# Patient Record
Sex: Male | Born: 1970
Health system: Southern US, Community
[De-identification: ages and names within clinical notes are randomized; demographics above are authoritative.]

## PROBLEM LIST (undated history)

## (undated) DIAGNOSIS — Z8619 Personal history of other infectious and parasitic diseases: Secondary | ICD-10-CM

## (undated) DIAGNOSIS — G43909 Migraine, unspecified, not intractable, without status migrainosus: Secondary | ICD-10-CM

## (undated) DIAGNOSIS — R519 Headache, unspecified: Secondary | ICD-10-CM

## (undated) DIAGNOSIS — G629 Polyneuropathy, unspecified: Secondary | ICD-10-CM

## (undated) DIAGNOSIS — R51 Headache: Secondary | ICD-10-CM

## (undated) HISTORY — PX: OTHER SURGICAL HISTORY: SHX169

## (undated) HISTORY — DX: Headache, unspecified: R51.9

## (undated) HISTORY — DX: Headache: R51

## (undated) HISTORY — PX: WISDOM TOOTH EXTRACTION: SHX21

## (undated) HISTORY — DX: Migraine, unspecified, not intractable, without status migrainosus: G43.909

## (undated) HISTORY — DX: Polyneuropathy, unspecified: G62.9

## (undated) HISTORY — DX: Personal history of other infectious and parasitic diseases: Z86.19

---

## 2015-05-31 ENCOUNTER — Telehealth: Payer: Self-pay | Admitting: Behavioral Health

## 2015-05-31 NOTE — Telephone Encounter (Signed)
Unable to reach patient at time of Pre-Visit Call. Per recording the number has been disconnected or no longer in service.

## 2015-06-01 ENCOUNTER — Ambulatory Visit (INDEPENDENT_AMBULATORY_CARE_PROVIDER_SITE_OTHER): Payer: Federal, State, Local not specified - PPO | Admitting: Physician Assistant

## 2015-06-01 ENCOUNTER — Encounter: Payer: Self-pay | Admitting: Physician Assistant

## 2015-06-01 VITALS — BP 108/76 | HR 81 | Temp 98.4°F | Resp 16 | Ht 70.5 in | Wt 213.1 lb

## 2015-06-01 DIAGNOSIS — G44209 Tension-type headache, unspecified, not intractable: Secondary | ICD-10-CM | POA: Diagnosis not present

## 2015-06-01 DIAGNOSIS — R195 Other fecal abnormalities: Secondary | ICD-10-CM

## 2015-06-01 MED ORDER — BACLOFEN 10 MG PO TABS: 10.0000 mg | ORAL_TABLET | Freq: Three times a day (TID) | ORAL | Status: DC

## 2015-06-01 MED ORDER — BUTALBITAL-ASPIRIN-CAFFEINE 50-325-40 MG PO CAPS: 1.0000 | ORAL_CAPSULE | Freq: Four times a day (QID) | ORAL | Status: DC | PRN

## 2015-06-01 NOTE — Patient Instructions (Signed)
Please go to the lab to pick up stool kit.  Please stay well hydrated. Take the Baclofen as directed when needed for tension headaches. Also take the Fiorinal as directed for tension headaches. Alternate ice/heat to neck when these headaches occur. Follow-up if these measures are not helping.

## 2015-06-01 NOTE — Progress Notes (Signed)
Patient presents to clinic today to establish care.  Patient c/o of intermittent headaches last for a couple of days described as a grip around his head associated with muscle tension and neck pain. Denies injury or trauma. Denies nausea, photophobia or phonophobia with headaches. Has taken OTC pain relievers with little relief of symptoms.  Patient endorses multi-year history of having multiple loose stools throughout the day. Denies abdominal pain, tenesmus, melena or hematochezia. Denies fever, chills or foreign travel. Is taking Immodium daily to help slow stools. Has never had assessment for this issue.  Past Medical History  Diagnosis Date  . Migraine   . Frequent headaches   . History of chicken pox   . Neuropathy     Right foot    Past Surgical History  Procedure Laterality Date  . Pinched nerve foot      Left  . Wisdom tooth extraction      No current outpatient prescriptions on file prior to visit.   No current facility-administered medications on file prior to visit.    Allergies  Allergen Reactions  . Hydrocodone Nausea And Vomiting  . Oxycodone Nausea And Vomiting    Family History  Problem Relation Age of Onset  . Migraines Mother     Living  . Healthy Father     Living  . Heart failure Maternal Grandmother   . Healthy Daughter     x1  . Allergies Son     x1    Social History   Social History  . Marital Status: Married    Spouse Name: N/A  . Number of Children: N/A  . Years of Education: N/A   Occupational History  . Not on file.   Social History Main Topics  . Smoking status: Former Smoker    Types: Cigarettes  . Smokeless tobacco: Never Used  . Alcohol Use: Not on file  . Drug Use: Not on file  . Sexual Activity: Not on file   Other Topics Concern  . Not on file   Social History Narrative   Review of Systems  Eyes: Negative for blurred vision, double vision and photophobia.  Gastrointestinal: Positive for diarrhea. Negative  for heartburn, nausea, vomiting, abdominal pain, constipation, blood in stool and melena.  Neurological: Positive for headaches. Negative for dizziness and loss of consciousness.  Psychiatric/Behavioral: The patient is not nervous/anxious.     BP 108/76 mmHg  Pulse 81  Temp(Src) 98.4 F (36.9 C) (Oral)  Resp 16  Ht 5' 10.5" (1.791 m)  Wt 213 lb 2 oz (96.673 kg)  BMI 30.14 kg/m2  SpO2 98%  Physical Exam  Constitutional: He is oriented to person, place, and time and well-developed, well-nourished, and in no distress.  HENT:  Head: Normocephalic and atraumatic.  Eyes: Conjunctivae are normal.  Neck: Muscular tenderness present.  Cardiovascular: Normal rate, regular rhythm, normal heart sounds and intact distal pulses.   Pulmonary/Chest: Effort normal and breath sounds normal. No respiratory distress. He has no wheezes. He has no rales. He exhibits no tenderness.  Abdominal: Normal appearance and bowel sounds are normal. There is no hepatosplenomegaly. There is no tenderness. There is no CVA tenderness. No hernia.  Musculoskeletal:       Cervical back: He exhibits tenderness, pain and spasm. He exhibits normal range of motion and no bony tenderness.  Neurological: He is alert and oriented to person, place, and time.  Skin: Skin is warm and dry. No rash noted.  Vitals reviewed.   No  results found for this or any previous visit (from the past 2160 hour(s)).  Assessment/Plan: Tension headache Rx Baclofen for muscle relaxation. Rx Fiorinal for tension headache. Alternate ice/heat. Limit heavy lifting. Follow-up if not improving.  Abnormal stools Chronic. Will obtain stool studies. Patient to start probiotic daily along with fiber supplement. Follow-up based on stool results.

## 2015-06-01 NOTE — Progress Notes (Signed)
Pre visit review using our clinic review tool, if applicable. No additional management support is needed unless otherwise documented below in the visit note/SLS  

## 2015-06-02 DIAGNOSIS — G44209 Tension-type headache, unspecified, not intractable: Secondary | ICD-10-CM | POA: Insufficient documentation

## 2015-06-02 DIAGNOSIS — R195 Other fecal abnormalities: Secondary | ICD-10-CM | POA: Insufficient documentation

## 2015-06-02 NOTE — Assessment & Plan Note (Signed)
Rx Baclofen for muscle relaxation. Rx Fiorinal for tension headache. Alternate ice/heat. Limit heavy lifting. Follow-up if not improving.

## 2015-06-02 NOTE — Assessment & Plan Note (Signed)
Chronic. Will obtain stool studies. Patient to start probiotic daily along with fiber supplement. Follow-up based on stool results.

## 2015-06-03 LAB — FECAL LACTOFERRIN, QUANT: LACTOFERRIN: NEGATIVE

## 2015-06-03 LAB — OVA AND PARASITE EXAMINATION: OP: NONE SEEN

## 2015-06-06 LAB — STOOL CULTURE

## 2015-06-07 ENCOUNTER — Other Ambulatory Visit: Payer: Self-pay | Admitting: Physician Assistant

## 2015-06-07 ENCOUNTER — Encounter: Payer: Self-pay | Admitting: Physician Assistant

## 2015-06-07 DIAGNOSIS — K529 Noninfective gastroenteritis and colitis, unspecified: Secondary | ICD-10-CM

## 2015-06-08 ENCOUNTER — Telehealth: Payer: Self-pay | Admitting: Physician Assistant

## 2015-06-08 DIAGNOSIS — G44229 Chronic tension-type headache, not intractable: Secondary | ICD-10-CM

## 2015-06-08 MED ORDER — TIZANIDINE HCL 2 MG PO CAPS: 2.0000 mg | ORAL_CAPSULE | Freq: Three times a day (TID) | ORAL | Status: DC

## 2015-06-08 NOTE — Telephone Encounter (Signed)
Please assess for any alarm symptoms. Stop the Baclofen We will send in Rx Zanaflex  to take as directed. Continue other medication as directed. Will also place referral to Neurology.

## 2015-06-08 NOTE — Telephone Encounter (Signed)
Patient informed, understood & agreed; no current emergent symptoms, discussed need for ED symptoms/SLS

## 2015-06-08 NOTE — Telephone Encounter (Signed)
Pt wife Lanora Manis, called stating pt is taking meds as directed. He is not feeling any better,maybe worse. She said that the headaches went away very briefly but came right back. Please call back at [phone].

## 2015-06-09 NOTE — Telephone Encounter (Signed)
If symptoms are severe he needs to go to the ER.

## 2015-06-09 NOTE — Telephone Encounter (Signed)
Neurology gave pt appt for 07/16/15. Pt took the Zanaflex and it didn't work either. He has taken 3 times. They want to know what to do.

## 2015-06-09 NOTE — Addendum Note (Signed)
Addended by: Marcelline Mates on: 06/09/2015 09:16 AM   Modules accepted: Orders

## 2015-06-09 NOTE — Telephone Encounter (Signed)
Spoke with patient, he states he is taking the Zanaflex and Fiorinal.  He got the Zanaflex yesterday and has taken around 4 times.  He states his headache goes away and comes back- he does not want to go to ED because it is mild-moderate.  He will give the Zanaflex more time.

## 2015-06-09 NOTE — Telephone Encounter (Signed)
Referral placed.

## 2015-06-09 NOTE — Telephone Encounter (Signed)
See below. Recommend giving the medication more time and make sure he is taking the other medications given. If patient still having severe headache, he needs ER assessment for prolonged headache.

## 2015-06-10 ENCOUNTER — Other Ambulatory Visit: Payer: Self-pay | Admitting: Physician Assistant

## 2015-06-14 NOTE — Telephone Encounter (Signed)
Rx faxed to pharmacy/SLS 

## 2015-06-29 ENCOUNTER — Encounter: Payer: Self-pay | Admitting: Physician Assistant

## 2015-06-29 ENCOUNTER — Other Ambulatory Visit (INDEPENDENT_AMBULATORY_CARE_PROVIDER_SITE_OTHER): Payer: Federal, State, Local not specified - PPO

## 2015-06-29 ENCOUNTER — Ambulatory Visit (INDEPENDENT_AMBULATORY_CARE_PROVIDER_SITE_OTHER): Payer: Federal, State, Local not specified - PPO | Admitting: Physician Assistant

## 2015-06-29 VITALS — BP 108/78 | HR 72 | Ht 70.0 in | Wt 211.2 lb

## 2015-06-29 DIAGNOSIS — R197 Diarrhea, unspecified: Secondary | ICD-10-CM | POA: Diagnosis not present

## 2015-06-29 LAB — COMPREHENSIVE METABOLIC PANEL
ALT: 29 U/L (ref 0–53)
AST: 22 U/L (ref 0–37)
Albumin: 4.5 g/dL (ref 3.5–5.2)
Alkaline Phosphatase: 73 U/L (ref 39–117)
BUN: 30 mg/dL — ABNORMAL HIGH (ref 6–23)
CALCIUM: 9.7 mg/dL (ref 8.4–10.5)
CHLORIDE: 105 meq/L (ref 96–112)
CO2: 27 meq/L (ref 19–32)
Creatinine, Ser: 1.09 mg/dL (ref 0.40–1.50)
GFR: 78.92 mL/min (ref >60.00)
GLUCOSE: 107 mg/dL — AB (ref 70–99)
Potassium: 4 mEq/L (ref 3.5–5.1)
Sodium: 138 mEq/L (ref 135–145)
Total Bilirubin: 0.3 mg/dL (ref 0.2–1.2)
Total Protein: 7.6 g/dL (ref 6.0–8.3)

## 2015-06-29 LAB — CBC WITH DIFFERENTIAL/PLATELET
BASOS ABS: 0 10*3/uL (ref 0.0–0.1)
BASOS PCT: 0.5 % (ref 0.0–3.0)
EOS ABS: 0.2 10*3/uL (ref 0.0–0.7)
Eosinophils Relative: 2 % (ref 0.0–5.0)
HEMATOCRIT: 44.1 % (ref 39.0–52.0)
Hemoglobin: 15 g/dL (ref 13.0–17.0)
LYMPHS ABS: 1.4 10*3/uL (ref 0.7–4.0)
LYMPHS PCT: 17 % (ref 12.0–46.0)
MCHC: 34.1 g/dL (ref 30.0–36.0)
MCV: 87.2 fl (ref 78.0–100.0)
Monocytes Absolute: 0.7 10*3/uL (ref 0.1–1.0)
Monocytes Relative: 7.9 % (ref 3.0–12.0)
NEUTROS ABS: 6 10*3/uL (ref 1.4–7.7)
NEUTROS PCT: 72.6 % (ref 43.0–77.0)
PLATELETS: 238 10*3/uL (ref 150.0–400.0)
RBC: 5.05 Mil/uL (ref 4.22–5.81)
RDW: 13 % (ref 11.5–15.5)
WBC: 8.2 10*3/uL (ref 4.0–10.5)

## 2015-06-29 LAB — IGA: IGA: 189 mg/dL (ref 68–378)

## 2015-06-29 LAB — TSH: TSH: 0.57 u[IU]/mL (ref 0.35–4.50)

## 2015-06-29 LAB — HIGH SENSITIVITY CRP: CRP HIGH SENSITIVITY: 1.98 mg/L (ref 0.000–5.000)

## 2015-06-29 MED ORDER — NA SULFATE-K SULFATE-MG SULF 17.5-3.13-1.6 GM/177ML PO SOLN: 1.0000 | ORAL | Status: DC

## 2015-06-29 NOTE — Progress Notes (Signed)
Patient ID: Scott Cummings, male   DOB: Jul 17, 1971, 44 y.o.   MRN: [id number]    HPI:  Scott Cummings is a 44 y.o.   male  referred by Waldon Merl, PA-C for evaluation of diarrhea. Scott Cummings states that up until 4 years ago, he would have a normal formed bowel movement on a daily basis. For the past 3-1/2-4 years, he has been having 5-6 mushy bowel movements daily. He has no nocturnal stooling. He has started using Imodium on a daily basis. With Imodium, he has 2-3 mushy bowel movements. He has no urgency and denies excessive gas and bloating. He is unable to identify any specific foods that exacerbate his symptoms. He has no associated abdominal pain, and has not had fever, chills, or night sweats. His appetite as been good and his weight has been stable. He has no complaints of epigastric pain, nausea, vomiting, or early satiety. He denies bright red blood per rectum or melena. He does state that his stools are often very oily and foul-smelling. Prior to his change in bowel habits, he had not traveled outside of the country, acquired any new pets, or had any antibiotics. He has city water and does not fish or swim in lakes or streams. No one else in the house has diarrhea. He is unaware of a family history of colon cancer, colon polyps, inflammatory bowel disease, or celiac disease. He was recently seen by Malva Cogan in at [facility] in [facility] and had stool for ova and parasites and stool culture that were negative. He has not had any associated joint pain, eye pain, oral ulcers, or skin rashes.    Past Medical History  Diagnosis Date  . Migraine   . Frequent headaches   . History of chicken pox   . Neuropathy (HCC)     Right foot    Past Surgical History  Procedure Laterality Date  . Pinched nerve foot      Left  . Wisdom tooth extraction     Family History  Problem Relation Age of Onset  . Migraines Mother     Living  . Healthy Father     Living  . Heart failure Maternal  Grandmother   . Healthy Daughter     x1  . Allergies Son     x1  . Stomach cancer      Great uncle   Social History  Substance Use Topics  . Smoking status: Former Smoker    Types: Cigarettes  . Smokeless tobacco: Never Used  . Alcohol Use: 0.0 oz/week    0 Standard drinks or equivalent per week     Comment: Very rare   Current Outpatient Prescriptions  Medication Sig Dispense Refill  . baclofen (LIORESAL) 10 MG tablet Take 1 tablet (10 mg total) by mouth 3 (three) times daily. 30 each 0  . butalbital-acetaminophen-caffeine (FIORICET, ESGIC) 50-325-40 MG tablet TAKE 1 TABLET BY MOUTH EVERY 6 HOURS AS NEEDED FOR HEADACHE 20 tablet 0  . butalbital-aspirin-caffeine (FIORINAL) 50-325-40 MG per capsule Take 1 capsule by mouth every 6 (six) hours as needed for headache. 20 capsule 0  . diazepam (VALIUM) 10 MG tablet Take 5-10 mg by mouth at bedtime as needed for sleep.    Marland Kitchen glucosamine-chondroitin 500-400 MG tablet Take 1 tablet by mouth daily.    Marland Kitchen ibuprofen (ADVIL,MOTRIN) 200 MG tablet Take 200 mg by mouth every 6 (six) hours as needed.    . loperamide (IMODIUM) 2 MG capsule Take  by mouth as needed for diarrhea or loose stools.    . Misc Natural Products (CURCUMAX PRO) TABS Take by mouth.    . Misc Natural Products (TURMERIC CURCUMIN) CAPS Take by mouth daily.    . Probiotic Product (PROBIOTIC & ACIDOPHILUS EX ST PO) Take by mouth daily.    . tizanidine (ZANAFLEX) 2 MG capsule Take 1 capsule (2 mg total) by mouth 3 (three) times daily. 90 capsule 1   No current facility-administered medications for this visit.   Allergies  Allergen Reactions  . Hydrocodone Nausea And Vomiting  . Oxycodone Nausea And Vomiting     Review of Systems: Gen: Denies any fever, chills, sweats, anorexia, fatigue, weakness, malaise, weight loss, and sleep disorder CV: Denies chest pain, angina, palpitations, syncope, orthopnea, PND, peripheral edema, and claudication. Resp: Denies dyspnea at rest,  dyspnea with exercise, cough, sputum, wheezing, coughing up blood, and pleurisy. GI: Denies vomiting blood, jaundice, and fecal incontinence.   Denies dysphagia or odynophagia. GU : Denies urinary burning, blood in urine, urinary frequency, urinary hesitancy, nocturnal urination, and urinary incontinence. MS: Denies joint pain, limitation of movement, and swelling, stiffness, low back pain, extremity pain. Denies muscle weakness, cramps, atrophy.  Derm: Denies rash, itching, dry skin, hives, moles, warts, or unhealing ulcers.  Psych: Denies depression, anxiety, memory loss, suicidal ideation, hallucinations, paranoia, and confusion. Heme: Denies bruising, bleeding, and enlarged lymph nodes. Neuro:  Denies any headaches, dizziness, paresthesias. Endo:  Denies any problems with DM, thyroid, adrenal function  LAB RESULTS: Stool culture 06/02/2015 was negative for salmonella, shigella, Campylobacter, Yersinia, or Escherichia coli. Stool for ova and parasites on 06/02/2015 had no ova or parasites seen.     Physical Exam: BP 108/78 mmHg  Pulse 72  Ht 5\' 10"  (1.778 m)  Wt 211 lb 4 oz (95.822 kg)  BMI 30.31 kg/m2 Constitutional: Pleasant,well-developed,male in no acute distress. HEENT: Normocephalic and atraumatic. Conjunctivae are normal. No scleral icterus. Neck supple.  No JVD. No thyromegaly Cardiovascular: Normal rate, regular rhythm.  Pulmonary/chest: Effort normal and breath sounds normal. No wheezing, rales or rhonchi. Abdominal: Soft, nondistended, nontender. Bowel sounds active throughout. There are no masses palpable. No hepatomegaly. Extremities: no edema Lymphadenopathy: No cervical adenopathy noted. Neurological: Alert and oriented to person place and time. Skin: Skin is warm and dry. No rashes noted. Psychiatric: Normal mood and affect. Behavior is normal.  ASSESSMENT AND PLAN:  44 year old male with a 4 year history of diarrhea referred for evaluation. Stool culture and  recent stool for O&P have been nonrevealing. A CBC, comprehensive metabolic panel, TSH, CRP, IgA, and TTG will be obtained along with a stool for C. difficile and a pancreatic fecal elastase. He will be scheduled for a colonoscopy to evaluate for polyps, neoplasia, IBD, or microscopic colitis.The risks, benefits, and alternatives to colonoscopy with possible biopsy and possible polypectomy were discussed with the patient and they consent to proceed. The procedure will be scheduled with Dr. Christella HartiganJacobs. Further recommendations will be made pending the findings of the above.    Scott Cummings, Tollie PizzaLori P PA-C 06/29/2015, 9:27 AM  CC: Waldon MerlMartin, William C, PA-C

## 2015-06-29 NOTE — Patient Instructions (Signed)
You have been scheduled for a colonoscopy. Please follow written instructions given to you at your visit today.  Please pick up your prep supplies at the pharmacy within the next 1-3 days. If you use inhalers (even only as needed), please bring them with you on the day of your procedure. Your physician has requested that you go to www.startemmi.com and enter the access code given to you at your visit today. This web site gives a general overview about your procedure. However, you should still follow specific instructions given to you by our office regarding your preparation for the procedure.  Your physician has requested that you go to the basement for lab work before leaving today.  

## 2015-06-30 ENCOUNTER — Other Ambulatory Visit: Payer: Federal, State, Local not specified - PPO

## 2015-06-30 ENCOUNTER — Telehealth: Payer: Self-pay | Admitting: Physician Assistant

## 2015-06-30 DIAGNOSIS — R197 Diarrhea, unspecified: Secondary | ICD-10-CM

## 2015-06-30 LAB — TISSUE TRANSGLUTAMINASE, IGA: Tissue Transglutaminase Ab, IgA: 1 U/mL (ref <4)

## 2015-06-30 NOTE — Progress Notes (Signed)
i agree with the above note, plan 

## 2015-06-30 NOTE — Telephone Encounter (Signed)
Spoke to patient. He will come by the office tomorrow and pick up a sample of Suprep.

## 2015-07-01 LAB — CLOSTRIDIUM DIFFICILE BY PCR: Toxigenic C. Difficile by PCR: NOT DETECTED

## 2015-07-08 ENCOUNTER — Telehealth: Payer: Self-pay | Admitting: Gastroenterology

## 2015-07-08 NOTE — Telephone Encounter (Signed)
Instructed wife that he is fine to have his colon as scheduled tomorrow.  Told her there may be small pieces in the colon, have him drink extra this afternoon and do prep as instructed. Wife verbalized understanding of these instructions.  Hilda LiasMarie PV

## 2015-07-09 ENCOUNTER — Ambulatory Visit (AMBULATORY_SURGERY_CENTER): Payer: Federal, State, Local not specified - PPO | Admitting: Gastroenterology

## 2015-07-09 ENCOUNTER — Encounter: Payer: Self-pay | Admitting: Gastroenterology

## 2015-07-09 VITALS — BP 105/72 | HR 65 | Temp 98.6°F | Resp 11 | Ht 70.0 in | Wt 211.0 lb

## 2015-07-09 DIAGNOSIS — R197 Diarrhea, unspecified: Secondary | ICD-10-CM

## 2015-07-09 LAB — PANCREATIC ELASTASE, FECAL: Pancreatic Elastase-1, Stool: 348 mcg/g

## 2015-07-09 MED ORDER — SODIUM CHLORIDE 0.9 % IV SOLN: 500.0000 mL | INTRAVENOUS | Status: DC

## 2015-07-09 NOTE — Progress Notes (Signed)
Report to PACU, RN, vss, BBS= Clear.  

## 2015-07-09 NOTE — Progress Notes (Signed)
Called to room to assist during endoscopic procedure.  Patient ID and intended procedure confirmed with present staff. Received instructions for my participation in the procedure from the performing physician.  

## 2015-07-09 NOTE — Patient Instructions (Signed)
Discharge instructions given. Normal exam. Resume previous medications. YOU HAD AN ENDOSCOPIC PROCEDURE TODAY AT THE [facility]:   Refer to the procedure report that was given to you for any specific questions about what was found during the examination.  If the procedure report does not answer your questions, please call your gastroenterologist to clarify.  If you requested that your care partner not be given the details of your procedure findings, then the procedure report has been included in a sealed envelope for you to review at your convenience later.  YOU SHOULD EXPECT: Some feelings of bloating in the abdomen. Passage of more gas than usual.  Walking can help get rid of the air that was put into your GI tract during the procedure and reduce the bloating. If you had a lower endoscopy (such as a colonoscopy or flexible sigmoidoscopy) you may notice spotting of blood in your stool or on the toilet paper. If you underwent a bowel prep for your procedure, you may not have a normal bowel movement for a few days.  Please Note:  You might notice some irritation and congestion in your nose or some drainage.  This is from the oxygen used during your procedure.  There is no need for concern and it should clear up in a day or so.  SYMPTOMS TO REPORT IMMEDIATELY:   Following lower endoscopy (colonoscopy or flexible sigmoidoscopy):  Excessive amounts of blood in the stool  Significant tenderness or worsening of abdominal pains  Swelling of the abdomen that is new, acute  Fever of 100F or higher   For urgent or emergent issues, a gastroenterologist can be reached at any hour by calling [phone].   DIET: Your first meal following the procedure should be a small meal and then it is ok to progress to your normal diet. Heavy or fried foods are harder to digest and may make you feel nauseous or bloated.  Likewise, meals heavy in dairy and vegetables can increase bloating.  Drink plenty  of fluids but you should avoid alcoholic beverages for 24 hours.  ACTIVITY:  You should plan to take it easy for the rest of today and you should NOT DRIVE or use heavy machinery until tomorrow (because of the sedation medicines used during the test).    FOLLOW UP: Our staff will call the number listed on your records the next business day following your procedure to check on you and address any questions or concerns that you may have regarding the information given to you following your procedure. If we do not reach you, we will leave a message.  However, if you are feeling well and you are not experiencing any problems, there is no need to return our call.  We will assume that you have returned to your regular daily activities without incident.  If any biopsies were taken you will be contacted by phone or by letter within the next 1-3 weeks.  Please call us at [phone] if you have not heard about the biopsies in 3 weeks.    SIGNATURES/CONFIDENTIALITY: You and/or your care partner have signed paperwork which will be entered into your electronic medical record.  These signatures attest to the fact that that the information above on your After Visit Summary has been reviewed and is understood.  Full responsibility of the confidentiality of this discharge information lies with you and/or your care-partner. 

## 2015-07-09 NOTE — Op Note (Signed)
[facility] [address] [place], [id number]   COLONOSCOPY PROCEDURE REPORT  PATIENT: Scott Cummings, Scott Cummings  MR#: [id number] BIRTHDATE: March 20, 1971 , 43  yrs. old GENDER: male ENDOSCOPIST: Rachael Feeaniel P Jacobs, MD REFERRED BY: Marcelline MatesWilliam Martin, MD PROCEDURE DATE:  07/09/2015 PROCEDURE:   Colonoscopy, diagnostic and Colonoscopy with biopsy First Screening Colonoscopy - Avg.  risk and is 50 yrs.  old or older - No.  Prior Negative Screening - Now for repeat screening. N/A  History of Adenoma - Now for follow-up colonoscopy & has been > or = to 3 yrs.  N/A  Recommend repeat exam, <10 yrs? No ASA CLASS:   Class II INDICATIONS:chronic loose stools (cbc, cmet, TTG, ova parasites, stool culture all normal...awaiting fecal elastase). MEDICATIONS: Monitored anesthesia care and Propofol 200 mg IV  DESCRIPTION OF PROCEDURE:   After the risks benefits and alternatives of the procedure were thoroughly explained, informed consent was obtained.  The digital rectal exam revealed no abnormalities of the rectum.   The LB [id number]  endoscope was introduced through the anus and advanced to the terminal ileum which was intubated for a short distance. No adverse events experienced.   The quality of the prep was excellent.  The instrument was then slowly withdrawn as the colon was fully examined. Estimated blood loss is zero unless otherwise noted in this procedure report.   COLON FINDINGS: The examined terminal ileum appeared to be normal. A normal appearing cecum, ileocecal valve, and appendiceal orifice were identified.  The ascending, transverse, descending, sigmoid colon, and rectum appeared unremarkable.  Multiple random biopsies were performed using cold forceps.  Samples were sent to R/O microscopic colitis.  Retroflexed views revealed no abnormalities. The time to cecum = 1.6 Withdrawal time = 6.7   The scope was withdrawn and the procedure completed. COMPLICATIONS: There were no  immediate complications.  ENDOSCOPIC IMPRESSION: 1.   The examined terminal ileum appeared to be normal 2.   Normal colonoscopy; multiple random biopsies were performed using cold forceps  RECOMMENDATIONS: Await pathology results For now, please start one OTC imodium every morning shorly after waking.  eSigned:  Rachael Feeaniel P Jacobs, MD 07/09/2015 10:36 AM

## 2015-07-12 ENCOUNTER — Telehealth: Payer: Self-pay | Admitting: *Deleted

## 2015-07-12 NOTE — Telephone Encounter (Signed)
  Follow up Call-  Call back number 07/09/2015  Post procedure Call Back phone  # [phone]  Permission to leave phone message Yes     Patient questions:  Message left to call us if necessary.

## 2015-07-16 ENCOUNTER — Encounter: Payer: Self-pay | Admitting: Gastroenterology

## 2015-07-16 ENCOUNTER — Encounter: Payer: Self-pay | Admitting: Neurology

## 2015-07-16 ENCOUNTER — Ambulatory Visit (INDEPENDENT_AMBULATORY_CARE_PROVIDER_SITE_OTHER): Payer: Federal, State, Local not specified - PPO | Admitting: Neurology

## 2015-07-16 VITALS — BP 112/80 | HR 72 | Ht 70.5 in | Wt 210.0 lb

## 2015-07-16 DIAGNOSIS — G44201 Tension-type headache, unspecified, intractable: Secondary | ICD-10-CM

## 2015-07-16 NOTE — Progress Notes (Signed)
NEUROLOGY CONSULTATION NOTE  Scott Cummings MRN: [id number] DOB: 11-07-1970  Referring provider: Piedad Climes, PA-C Primary care provider: Piedad Climes, PA-C  Reason for consult:  headache  HISTORY OF PRESENT ILLNESS: Scott Cummings is a 44 year old right-handed male who presents for headache.  History obtained by patient and PCP note.  Labs reviewed.  Onset:  End of September and lasted 3 weeks Location:  Band-like distribution Quality:  squeezing Intensity:  3-8/10 Aura:  no Prodrome:  no Associated symptoms:  no Duration:  Constant but fluctuated in intensity Frequency:  constant Triggers/exacerbating factors:  none Relieving factors:  none  Past abortive medication:  Fioricet, baclofen, tizanidine, ibuprofen (he would wait a while before taking it) Past preventative medication:  none Other past therapy:  none  Current abortive medication:  Ibuprofen if needed Antihypertensive medications:  none Antidepressant medications:  none Anticonvulsant medications:  none Vitamins/Herbal/Supplements:  none Other therapy:  none Other medication:  Valium  CBC and CMP were unremarkable except for mildly elevated BUN of 30.  Caffeine:  Recently stopped Alcohol:  rarely Smoker:  no Diet:  Does not keep hydrated.  Does not eat much vegetables Exercise:  no Depression/stress:  Some mild to moderate stress Sleep hygiene:  Poor.  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 Past history of headaches:  He had migraines as a child.  Over the years, he has some tension headaches, particularly triggered when he has a shift in sleep patterns during the weekend Family history of headache:  Mother. Other family history:  Father had cerebral aneurysm.  PAST MEDICAL HISTORY: Past Medical History  Diagnosis Date  . Migraine   . Frequent headaches   . History of chicken pox   . Neuropathy (HCC)     Right foot    PAST SURGICAL  HISTORY: Past Surgical History  Procedure Laterality Date  . Pinched nerve foot      Left  . Wisdom tooth extraction      MEDICATIONS: Current Outpatient Prescriptions on File Prior to Visit  Medication Sig Dispense Refill  . diazepam (VALIUM) 10 MG tablet Take 5-10 mg by mouth at bedtime as needed for sleep.    . butalbital-acetaminophen-caffeine (FIORICET, ESGIC) 50-325-40 MG tablet TAKE 1 TABLET BY MOUTH EVERY 6 HOURS AS NEEDED FOR HEADACHE (Patient not taking: Reported on 07/09/2015) 20 tablet 0  . butalbital-aspirin-caffeine (FIORINAL) 50-325-40 MG per capsule Take 1 capsule by mouth every 6 (six) hours as needed for headache. (Patient not taking: Reported on 07/09/2015) 20 capsule 0  . glucosamine-chondroitin 500-400 MG tablet Take 1 tablet by mouth daily.    Marland Kitchen ibuprofen (ADVIL,MOTRIN) 200 MG tablet Take 200 mg by mouth every 6 (six) hours as needed.    . loperamide (IMODIUM) 2 MG capsule Take by mouth as needed for diarrhea or loose stools.    . Misc Natural Products (CURCUMAX PRO) TABS Take by mouth.    . Misc Natural Products (TURMERIC CURCUMIN) CAPS Take by mouth daily.    . Probiotic Product (PROBIOTIC & ACIDOPHILUS EX ST PO) Take by mouth daily.    . tizanidine (ZANAFLEX) 2 MG capsule Take 1 capsule (2 mg total) by mouth 3 (three) times daily. (Patient not taking: Reported on 07/09/2015) 90 capsule 1   No current facility-administered medications on file prior to visit.    ALLERGIES: Allergies  Allergen Reactions  . Hydrocodone Nausea And Vomiting  . Oxycodone Nausea And Vomiting    FAMILY  HISTORY: Family History  Problem Relation Age of Onset  . Migraines Mother     Living  . Healthy Father     Living  . Heart failure Maternal Grandmother   . Healthy Daughter     x1  . Allergies Son     x1  . Stomach cancer      Great uncle  . Colon cancer Neg Hx     SOCIAL HISTORY: Social History   Social History  . Marital Status: Married    Spouse Name: N/A  .  Number of Children: 2  . Years of Education: N/A   Occupational History  . Not on file.   Social History Main Topics  . Smoking status: Former Smoker    Types: Cigarettes  . Smokeless tobacco: Never Used  . Alcohol Use: 0.0 oz/week    0 Standard drinks or equivalent per week     Comment: Very rare  . Drug Use: Not on file  . Sexual Activity: Not on file   Other Topics Concern  . Not on file   Social History Narrative   Pt lives with his wife. Lives in a three story homes, no issues with stairs. Some college    REVIEW OF SYSTEMS: Constitutional: No fevers, chills, or sweats, no generalized fatigue, change in appetite Eyes: No visual changes, double vision, eye pain Ear, nose and throat: No hearing loss, ear pain, nasal congestion, sore throat Cardiovascular: No chest pain, palpitations Respiratory:  No shortness of breath at rest or with exertion, wheezes GastrointestinaI: No nausea, vomiting, diarrhea, abdominal pain, fecal incontinence Genitourinary:  No dysuria, urinary retention or frequency Musculoskeletal:  No neck pain, back pain Integumentary: No rash, pruritus, skin lesions Neurological: as above Psychiatric: sleep disturbance Endocrine: No palpitations, fatigue, diaphoresis, mood swings, change in appetite, change in weight, increased thirst Hematologic/Lymphatic:  No anemia, purpura, petechiae. Allergic/Immunologic: no itchy/runny eyes, nasal congestion, recent allergic reactions, rashes  PHYSICAL EXAM: Filed Vitals:   07/16/15 0949  BP: 112/80  Pulse: 72   General: No acute distress.  Patient appears well-groomed.  Head:  Normocephalic/atraumatic Eyes:  fundi unremarkable, without vessel changes, exudates, hemorrhages or papilledema. Neck: supple, no paraspinal tenderness, full range of motion Back: No paraspinal tenderness Heart: regular rate and rhythm Lungs: Clear to auscultation bilaterally. Vascular: No carotid bruits. Neurological Exam: Mental  status: alert and oriented to person, place, and time, recent and remote memory intact, fund of knowledge intact, attention and concentration intact, speech fluent and not dysarthric, language intact. Cranial nerves: CN I: not tested CN II: pupils equal, round and reactive to light, visual fields intact, fundi unremarkable, without vessel changes, exudates, hemorrhages or papilledema. CN III, IV, VI:  full range of motion, no nystagmus, no ptosis CN V: facial sensation intact CN VII: upper and lower face symmetric CN VIII: hearing intact CN IX, X: gag intact, uvula midline CN XI: sternocleidomastoid and trapezius muscles intact CN XII: tongue midline Bulk & Tone: normal, no fasciculations. Motor:  5/5 throughout Sensation: temperature and vibration sensation intact. Deep Tendon Reflexes:  2+ throughout, toes downgoing.  Finger to nose testing:  Without dysmetria.  Heel to shin:  Without dysmetria.  Gait:  Normal station and stride.  Able to turn and tandem walk. Romberg negative.  IMPRESSION: Tension-type headache, intractable, resolved about 2 weeks ago.   1.  Although his father had a cerebral aneurysm, I don't suspect these headaches are secondary to an aneurysm as they have resolved and he has longstanding history  of headaches.  Typically, we don't screen for aneurysms unless patient has 2 first degree relatives with a history. 2.  If he has a headache, advised to take ibuprofen at earliest onset of headache, but to limit use to no more than 2 days out of the week to prevent rebound.  I also advised not to take Fioricet as it often causes rebound headaches. 3.  We stressed lifestyle modification:  Improve diet, increase water intake, routine exercise and try to improve sleep hygiene if possible. 4.  He will call us if he has recurrence of headache.  Otherwise, follow up as needed.  Thank you for allowing me to take part in the care of this patient.  Shon Millet, DO  CC:  Piedad Climes, PA-C

## 2015-07-16 NOTE — Patient Instructions (Signed)
It does sound like you had tension headaches.  1.  Limit use of pain relievers to no more than 2 days out of the week.  These medications include acetaminophen, ibuprofen, triptans and narcotics.  This will help reduce risk of rebound headaches. 2.    Be aware of common food triggers such as processed sweets, processed foods with nitrites (such as deli meat, hot dogs, sausages), foods with MSG, alcohol (such as wine), chocolate, certain cheeses, certain fruits (dried fruits, some citrus fruit), vinegar, diet soda. 3.  Avoid caffeine 4.  Routine exercise 5.  Proper sleep hygiene 6.  Stay adequately hydrated with water 7.  Maintain proper stress management. 8.  Do not skip meals. 9.  Consider supplements:  Magnesium oxide  to  daily, riboflavin , Coenzyme Q 10  three times daily 10.  Recommend Mediterranean diet    Why follow it? Research shows. . Those who follow the Mediterranean diet have a reduced risk of heart disease  . The diet is associated with a reduced incidence of Parkinson's and Alzheimer's diseases . People following the diet may have longer life expectancies and lower rates of chronic diseases  . The Dietary Guidelines for Americans recommends the Mediterranean diet as an eating plan to promote health and prevent disease  What Is the Mediterranean Diet?  . Healthy eating plan based on typical foods and recipes of Mediterranean-style cooking . The diet is primarily a plant based diet; these foods should make up a majority of meals   Starches - Plant based foods should make up a majority of meals - They are an important sources of vitamins, minerals, energy, antioxidants, and fiber - Choose whole grains, foods high in fiber and minimally processed items  - Typical grain sources include wheat, oats, barley, corn, brown rice, bulgar, farro, millet, polenta, couscous  - Various types of beans include chickpeas, lentils, fava beans, black beans, white beans    Fruits  Veggies - Large quantities of antioxidant rich fruits & veggies; 6 or more servings  - Vegetables can be eaten raw or lightly drizzled with oil and cooked  - Vegetables common to the traditional Mediterranean Diet include: artichokes, arugula, beets, broccoli, brussel sprouts, cabbage, carrots, celery, collard greens, cucumbers, eggplant, kale, leeks, lemons, lettuce, mushrooms, okra, onions, peas, peppers, potatoes, pumpkin, radishes, rutabaga, shallots, spinach, sweet potatoes, turnips, zucchini - Fruits common to the Mediterranean Diet include: apples, apricots, avocados, cherries, clementines, dates, figs, grapefruits, grapes, melons, nectarines, oranges, peaches, pears, pomegranates, strawberries, tangerines  Fats - Replace butter and margarine with healthy oils, such as olive oil, canola oil, and tahini  - Limit nuts to no more than a handful a day  - Nuts include walnuts, almonds, pecans, pistachios, pine nuts  - Limit or avoid candied, honey roasted or heavily salted nuts - Olives are central to the [employer] - can be eaten whole or used in a variety of dishes   Meats Protein - Limiting red meat: no more than a few times a month - When eating red meat: choose lean cuts and keep the portion to the size of deck of cards - Eggs: approx. 0 to 4 times a week  - Fish and lean poultry: at least 2 a week  - Healthy protein sources include, chicken, [place], lean beef, lamb - Increase intake of seafood such as tuna, salmon, trout, mackerel, shrimp, scallops - Avoid or limit high fat processed meats such as sausage and bacon  Dairy - Include moderate amounts of  low fat dairy products  - Focus on healthy dairy such as fat free yogurt, skim milk, low or reduced fat cheese - Limit dairy products higher in fat such as whole or 2% milk, cheese, ice cream  Alcohol - Moderate amounts of red wine is ok  - No more than 5 oz daily for women (all ages) and men older than age 42  - No more  than 10 oz of wine daily for men younger than 5465  Other - Limit sweets and other desserts  - Use herbs and spices instead of salt to flavor foods  - Herbs and spices common to the traditional Mediterranean Diet include: basil, bay leaves, chives, cloves, cumin, fennel, garlic, lavender, marjoram, mint, oregano, parsley, pepper, rosemary, sage, savory, sumac, tarragon, thyme   It's not just a diet, it's a lifestyle:  . The Mediterranean diet includes lifestyle factors typical of those in the region  . Foods, drinks and meals are best eaten with others and savored . Daily physical activity is important for overall good health . This could be strenuous exercise like running and aerobics . This could also be more leisurely activities such as walking, housework, yard-work, or taking the stairs . Moderation is the key; a balanced and healthy diet accommodates most foods and drinks . Consider portion sizes and frequency of consumption of certain foods   Meal Ideas & Options:  . Breakfast:  o Whole wheat toast or whole wheat English muffins with peanut butter & hard boiled egg o Steel cut oats topped with apples & cinnamon and skim milk  o Fresh fruit: banana, strawberries, melon, berries, peaches  o Smoothies: strawberries, bananas, greek yogurt, peanut butter o Low fat greek yogurt with blueberries and granola  o Egg white omelet with spinach and mushrooms o Breakfast couscous: whole wheat couscous, apricots, skim milk, cranberries  . Sandwiches:  o Hummus and grilled vegetables (peppers, zucchini, squash) on whole wheat bread   o Grilled chicken on whole wheat pita with lettuce, tomatoes, cucumbers or tzatziki  o Tuna salad on whole wheat bread: tuna salad made with greek yogurt, olives, red peppers, capers, green onions o Garlic rosemary lamb pita: lamb sauted with garlic, rosemary, salt & pepper; add lettuce, cucumber, greek yogurt to pita - flavor with lemon juice and black pepper   . Seafood:  o Mediterranean grilled salmon, seasoned with garlic, basil, parsley, lemon juice and black pepper o Shrimp, lemon, and spinach whole-grain pasta salad made with low fat greek yogurt  o Seared scallops with lemon orzo  o Seared tuna steaks seasoned salt, pepper, coriander topped with tomato mixture of olives, tomatoes, olive oil, minced garlic, parsley, green onions and cappers  . Meats:  o Herbed greek chicken salad with kalamata olives, cucumber, feta  o Red bell peppers stuffed with spinach, bulgur, lean ground beef (or lentils) & topped with feta   o Kebabs: skewers of chicken, tomatoes, onions, zucchini, squash  o [place] burgers: made with red onions, mint, dill, lemon juice, feta cheese topped with roasted red peppers . Vegetarian o Cucumber salad: cucumbers, artichoke hearts, celery, red onion, feta cheese, tossed in olive oil & lemon juice  o Hummus and whole grain pita points with a greek salad (lettuce, tomato, feta, olives, cucumbers, red onion) o Lentil soup with celery, carrots made with vegetable broth, garlic, salt and pepper  o Tabouli salad: parsley, bulgur, mint, scallions, cucumbers, tomato, radishes, lemon juice, olive oil, salt and pepper. 11 Follow up as needed.

## 2015-07-28 ENCOUNTER — Telehealth: Payer: Self-pay | Admitting: Physician Assistant

## 2015-07-29 NOTE — Telephone Encounter (Signed)
error 

## 2015-07-30 ENCOUNTER — Encounter: Payer: Self-pay | Admitting: Physician Assistant

## 2015-07-30 ENCOUNTER — Ambulatory Visit (INDEPENDENT_AMBULATORY_CARE_PROVIDER_SITE_OTHER): Payer: Federal, State, Local not specified - PPO | Admitting: Physician Assistant

## 2015-07-30 VITALS — BP 116/80 | HR 79 | Temp 98.1°F | Resp 16 | Ht 71.0 in | Wt 213.0 lb

## 2015-07-30 DIAGNOSIS — M222X1 Patellofemoral disorders, right knee: Secondary | ICD-10-CM | POA: Diagnosis not present

## 2015-07-30 DIAGNOSIS — M222X9 Patellofemoral disorders, unspecified knee: Secondary | ICD-10-CM | POA: Insufficient documentation

## 2015-07-30 MED ORDER — MELOXICAM 15 MG PO TABS: 15.0000 mg | ORAL_TABLET | Freq: Every day | ORAL | Status: DC

## 2015-07-30 NOTE — Progress Notes (Signed)
Pre visit review using our clinic review tool, if applicable. No additional management support is needed unless otherwise documented below in the visit note/SLS  

## 2015-07-30 NOTE — Patient Instructions (Signed)
Please ice the knee a couple of times per day. Elevate while resting. Take the Mobic daily with food as directed. Avoid kneeling on the knee. Get a knee sleeve at the pharmacy and wear daily.   Call if symptoms not improving/resolving over next 1-2 weeks

## 2015-07-30 NOTE — Assessment & Plan Note (Signed)
Rx Mobic daily. RICE. Knee sleeve recommended. Avoid kneeling. Follow-up if not resolving in 1-2 weeks.

## 2015-07-30 NOTE — Progress Notes (Signed)
Patient presents to clinic today c/o few days of right anterior knee pain with kneeling that is sharp and non-radiating. Denies trauma or injury. Denies numbness or tingling. Denies swelling. Has been able to ambulate without pain or difficulty. Has not taken anything for symptoms.  Past Medical History  Diagnosis Date  . Migraine   . Frequent headaches   . History of chicken pox   . Neuropathy ([place])     Right foot    Current Outpatient Prescriptions on File Prior to Visit  Medication Sig Dispense Refill  . diazepam (VALIUM) 10 MG tablet Take 5-10 mg by mouth at bedtime as needed for sleep.    Marland Kitchen ibuprofen (ADVIL,MOTRIN) 200 MG tablet Take 200 mg by mouth every 6 (six) hours as needed.    . loperamide (IMODIUM) 2 MG capsule Take by mouth as needed for diarrhea or loose stools.    . Misc Natural Products (CURCUMAX PRO) TABS Take by mouth.     No current facility-administered medications on file prior to visit.    Allergies  Allergen Reactions  . Hydrocodone Nausea And Vomiting  . Oxycodone Nausea And Vomiting    Family History  Problem Relation Age of Onset  . Migraines Mother     Living  . Healthy Father     Living  . Heart failure Maternal Grandmother   . Healthy Daughter     x1  . Allergies Son     x1  . Stomach cancer      Great uncle  . Colon cancer Neg Hx     Social History   Social History  . Marital Status: Married    Spouse Name: N/A  . Number of Children: 2  . Years of Education: N/A   Social History Main Topics  . Smoking status: Former Smoker    Types: Cigarettes  . Smokeless tobacco: Never Used  . Alcohol Use: 0.0 oz/week    0 Standard drinks or equivalent per week     Comment: Very rare  . Drug Use: None  . Sexual Activity: Not Asked   Other Topics Concern  . None   Social History Narrative   Pt lives with his wife. Lives in a three story homes, no issues with stairs. Some college    Review of Systems - See HPI.  All other ROS are  negative.  BP 116/80 mmHg  Pulse 79  Temp(Src) 98.1 F (36.7 C) (Oral)  Resp 16  Ht '5\' 11"'  (1.803 m)  Wt 213 lb (96.616 kg)  BMI 29.72 kg/m2  SpO2 97%  Physical Exam  Constitutional: He is oriented to person, place, and time and well-developed, well-nourished, and in no distress.  HENT:  Head: Normocephalic and atraumatic.  Cardiovascular: Normal rate, regular rhythm, normal heart sounds and intact distal pulses.   Pulmonary/Chest: Effort normal.  Musculoskeletal:       Right knee: He exhibits normal range of motion, no swelling, normal alignment, no LCL laxity, normal patellar mobility, normal meniscus and no MCL laxity. Tenderness found. Lateral joint line tenderness noted. No patellar tendon tenderness noted.  Neurological: He is alert and oriented to person, place, and time.  Skin: Skin is warm and dry. No rash noted.  Vitals reviewed.   Recent Results (from the past 2160 hour(s))  Stool Culture     Status: None   Collection Time: 06/02/15 11:47 AM  Result Value Ref Range   Culture      No Salmonella,Shigella,Campylobacter,Yersinia,or No E.coli 0157:H7 isolated.  Organism ID, Bacteria No Salmonella,Shigella,Campylobacter,Yersinia,or    Organism ID, Bacteria No E.coli 0157:H7 isolated.   Stool, WBC/Lactoferrin     Status: None   Collection Time: 06/02/15 11:47 AM  Result Value Ref Range   Lactoferrin NEGATIVE   Ova and parasite examination     Status: None   Collection Time: 06/02/15 11:47 AM  Result Value Ref Range   OP No Ova or Parasites Seen    CBC w/Diff     Status: None   Collection Time: 06/29/15  9:54 AM  Result Value Ref Range   WBC 8.2 4.0 - 10.5 K/uL   RBC 5.05 4.22 - 5.81 Mil/uL   Hemoglobin 15.0 13.0 - 17.0 g/dL   HCT 44.1 39.0 - 52.0 %   MCV 87.2 78.0 - 100.0 fl   MCHC 34.1 30.0 - 36.0 g/dL   RDW 13.0 11.5 - 15.5 %   Platelets 238.0 150.0 - 400.0 K/uL   Neutrophils Relative % 72.6 43.0 - 77.0 %   Lymphocytes Relative 17.0 12.0 - 46.0 %    Monocytes Relative 7.9 3.0 - 12.0 %   Eosinophils Relative 2.0 0.0 - 5.0 %   Basophils Relative 0.5 0.0 - 3.0 %   Neutro Abs 6.0 1.4 - 7.7 K/uL   Lymphs Abs 1.4 0.7 - 4.0 K/uL   Monocytes Absolute 0.7 0.1 - 1.0 K/uL   Eosinophils Absolute 0.2 0.0 - 0.7 K/uL   Basophils Absolute 0.0 0.0 - 0.1 K/uL  Comp Met (CMET)     Status: Abnormal   Collection Time: 06/29/15  9:54 AM  Result Value Ref Range   Sodium 138 135 - 145 mEq/L   Potassium 4.0 3.5 - 5.1 mEq/L   Chloride 105 96 - 112 mEq/L   CO2 27 19 - 32 mEq/L   Glucose, Bld 107 (H) 70 - 99 mg/dL   BUN 30 (H) 6 - 23 mg/dL   Creatinine, Ser 1.09 0.40 - 1.50 mg/dL   Total Bilirubin 0.3 0.2 - 1.2 mg/dL   Alkaline Phosphatase 73 39 - 117 U/L   AST 22 0 - 37 U/L   ALT 29 0 - 53 U/L   Total Protein 7.6 6.0 - 8.3 g/dL   Albumin 4.5 3.5 - 5.2 g/dL   Calcium 9.7 8.4 - 10.5 mg/dL   GFR 78.92 >60.00 mL/min  TSH     Status: None   Collection Time: 06/29/15  9:54 AM  Result Value Ref Range   TSH 0.57 0.35 - 4.50 uIU/mL  CRP High sensitivity     Status: None   Collection Time: 06/29/15  9:54 AM  Result Value Ref Range   CRP, High Sensitivity 1.980 0.000 - 5.000 mg/L    Comment: Note:  An elevated hs-CRP (>5 mg/L) should be repeated after 2 weeks to rule out recent infection or trauma.  IgA     Status: None   Collection Time: 06/29/15  9:54 AM  Result Value Ref Range   IgA 189 68 - 378 mg/dL    Comment:    Tissue transglutaminase, IgA     Status: None   Collection Time: 06/29/15  9:54 AM  Result Value Ref Range   Tissue Transglutaminase Ab, IgA 1 <4 U/mL    Comment: Value Interpretation:   <4:   Antibody Not Detected  >=4:   Antibody Detected   Pancreatic Elastase, Fecal     Status: None   Collection Time: 06/30/15  2:06 PM  Result Value Ref Range  Pancreatic Elastase-1, Stool 348 mcg/g    Comment:   Adult and Pediatric Reference Ranges for   Pancreatic Elastase-1:                Normal:      >200 mcg/g Moderate Pancreatic        Insufficiency:   100-200 mcg/g   Severe Pancreatic       Insufficiency:      <100 mcg/g   Elastase-1 (E-1) assay results are expressed in mcg/g, which represent mcg E1/g feces.   It is not necessary to interrupt enzyme substitution therapy.   Clostridium Difficile by PCR     Status: None   Collection Time: 06/30/15  2:06 PM  Result Value Ref Range   Toxigenic C Difficile by pcr Not Detected Not Detected    Comment: This test is for use only with liquid or soft stools; performance characteristics of other clinical specimen types have not been established.   This assay was performed by Cepheid GeneXpert(R) PCR. The performance characteristics of this assay have been determined by [employer]. Performance characteristics refer to the analytical performance of the test.     Assessment/Plan: Patellofemoral syndrome Rx Mobic daily. RICE. Knee sleeve recommended. Avoid kneeling. Follow-up if not resolving in 1-2 weeks.

## 2016-02-10 ENCOUNTER — Ambulatory Visit (HOSPITAL_BASED_OUTPATIENT_CLINIC_OR_DEPARTMENT_OTHER)
Admission: RE | Admit: 2016-02-10 | Discharge: 2016-02-10 | Disposition: A | Discharge status: Home / Self Care | Payer: Federal, State, Local not specified - PPO | Source: Ambulatory Visit | Attending: Medical | Admitting: Medical

## 2016-02-10 ENCOUNTER — Telehealth: Payer: Self-pay

## 2016-02-10 ENCOUNTER — Ambulatory Visit (INDEPENDENT_AMBULATORY_CARE_PROVIDER_SITE_OTHER): Payer: Federal, State, Local not specified - PPO | Admitting: Medical

## 2016-02-10 ENCOUNTER — Encounter: Payer: Self-pay | Admitting: Medical

## 2016-02-10 VITALS — BP 120/80 | HR 78 | Temp 98.1°F | Ht 70.0 in | Wt 212.2 lb

## 2016-02-10 DIAGNOSIS — L089 Local infection of the skin and subcutaneous tissue, unspecified: Secondary | ICD-10-CM | POA: Diagnosis not present

## 2016-02-10 DIAGNOSIS — M79661 Pain in right lower leg: Secondary | ICD-10-CM | POA: Insufficient documentation

## 2016-02-10 DIAGNOSIS — T148 Other injury of unspecified body region: Secondary | ICD-10-CM | POA: Diagnosis not present

## 2016-02-10 DIAGNOSIS — S8011XA Contusion of right lower leg, initial encounter: Secondary | ICD-10-CM | POA: Diagnosis not present

## 2016-02-10 DIAGNOSIS — T148XXA Other injury of unspecified body region, initial encounter: Secondary | ICD-10-CM

## 2016-02-10 MED ORDER — TRAMADOL HCL 50 MG PO TABS: 50.0000 mg | ORAL_TABLET | Freq: Four times a day (QID) | ORAL | Status: DC | PRN

## 2016-02-10 MED ORDER — CEPHALEXIN 500 MG PO CAPS: 500.0000 mg | ORAL_CAPSULE | Freq: Two times a day (BID) | ORAL | Status: DC

## 2016-02-10 MED ORDER — DICLOFENAC SODIUM 75 MG PO TBEC: 75.0000 mg | DELAYED_RELEASE_TABLET | Freq: Two times a day (BID) | ORAL | Status: DC

## 2016-02-10 NOTE — Patient Instructions (Addendum)
For your contusion and abrasion.   Will get xray of tibia. Today. For mild-moderate  pain diclofenac. Rx tramadol for moderate to severe pain.(rx advisement given)  If fracture seen then refer to sports med.  Will get wound culture to see if grows out culture. Will go ahead and rx cephalexin antibiotic.  Follow up in 7-10 days or as needed

## 2016-02-10 NOTE — Progress Notes (Signed)
   Subjective:    Patient ID: Scott Cummings, male    DOB: October 24, 1970, 45 y.o.   MRN: [id number]  HPI  Pt in states he got a wound/injury on Monday.He got hit by board hit him at work. He states skin scraped off[facility]  He has pain on walking. No creamy discharge. (but notes redness around edge of wound)  No fever, no chills or sweats.   No diabetes.    Review of Systems  Constitutional: Negative for fever and chills.  Respiratory: Negative for cough, choking, shortness of breath and wheezing.   Cardiovascular: Negative for chest pain and palpitations.  Skin:       Rt lower extremity abrasion and contusion.  Psychiatric/Behavioral: Negative for behavioral problems and confusion.       Objective:   Physical Exam  General- No acute distress. Pleasant patient.  Rt lower ext- distal tibia area 2.5 cm by 1 cm abrasion. Moderate depth. Scab present . Some redness at edges. Very tender to palpation.      Assessment & Plan:  For your contusion and abrasion.   Will get xray of tibia. Today. For mild-moderate  pain diclofenac. Rx tramadol for moderate to severe pain.  If fracture seen then refer to sports med.  Will get wound culture to see if grows out culture. Will go ahead and rx cephalexin antibiotic.  Follow up in 7-10 days or as needed

## 2016-02-10 NOTE — Progress Notes (Signed)
Pre visit review using our clinic review tool, if applicable. No additional management support is needed unless otherwise documented below in the visit note. 

## 2016-02-10 NOTE — Telephone Encounter (Signed)
LVOM for pt to call back with any questions about his x-rays.

## 2016-02-10 NOTE — Addendum Note (Signed)
Addended by: Neldon LabellaMABE, HOLDEN S on: 02/10/2016 10:59 AM   Modules accepted: Orders

## 2016-02-13 LAB — WOUND CULTURE
GRAM STAIN: NONE SEEN
Gram Stain: NONE SEEN
Gram Stain: NONE SEEN
Organism ID, Bacteria: NO GROWTH

## 2016-04-07 ENCOUNTER — Ambulatory Visit (INDEPENDENT_AMBULATORY_CARE_PROVIDER_SITE_OTHER): Payer: Federal, State, Local not specified - PPO | Admitting: Family Medicine

## 2016-04-07 ENCOUNTER — Encounter: Payer: Self-pay | Admitting: Family Medicine

## 2016-04-07 VITALS — BP 100/80 | HR 102 | Temp 98.4°F | Ht 70.0 in | Wt 207.0 lb

## 2016-04-07 DIAGNOSIS — R197 Diarrhea, unspecified: Secondary | ICD-10-CM | POA: Diagnosis not present

## 2016-04-07 MED ORDER — ONDANSETRON 8 MG PO TBDP: 8.0000 mg | ORAL_TABLET | Freq: Three times a day (TID) | ORAL | 0 refills | Status: DC | PRN

## 2016-04-07 NOTE — Progress Notes (Signed)
Subjective:     Patient ID: Scott Cummings, male   DOB: 08-17-1971, 45 y.o.   MRN: [id number]  HPI Acute visit for nausea and diarrhea. Onset 2 days ago. He has not had any vomiting. He initially noticed some low-grade fever Wednesday along with watery nonbloody stools. He's had about 4-5 diarrhea stools per day. Imodium helps. No bloody stools. No recent travels. No recent antibiotics. Has also had some diffuse abdominal cramping. Mild sore throat. No sick contacts.  Past Medical History:  Diagnosis Date  . Frequent headaches   . History of chicken pox   . Migraine   . Neuropathy (HCC)    Right foot   Past Surgical History:  Procedure Laterality Date  . pinched nerve foot     Left  . WISDOM TOOTH EXTRACTION      reports that he has quit smoking. His smoking use included Cigarettes. He has never used smokeless tobacco. He reports that he drinks alcohol. His drug history is not on file. family history includes Allergies in his son; Healthy in his daughter and father; Heart failure in his maternal grandmother; Migraines in his mother. Allergies  Allergen Reactions  . Hydrocodone Nausea And Vomiting  . Oxycodone Nausea And Vomiting     Review of Systems  Constitutional: Positive for chills, fatigue and fever.  Respiratory: Negative for shortness of breath.   Gastrointestinal: Positive for diarrhea and nausea. Negative for abdominal distention, blood in stool and vomiting.  Neurological: Negative for dizziness.       Objective:   Physical Exam  Constitutional: He appears well-developed and well-nourished.  HENT:  Mouth/Throat: Oropharynx is clear and moist.  Neck: Neck supple.  Cardiovascular: Normal rate and regular rhythm.   Pulmonary/Chest: Effort normal and breath sounds normal. No respiratory distress. He has no wheezes. He has no rales.  Abdominal: Soft. Bowel sounds are normal. He exhibits no distension and no mass. There is no tenderness. There is no rebound and no  guarding.       Assessment:     Diarrhea. Suspect viral illness. Does not appear dehydrated.    Plan:     -Zofran 8 mg every 8 hours as needed for nausea and vomiting -Handout on appropriate diet for diarrhea given -Consider supplementing water intake with something like Gatorade for electrolyte replacement -Follow-up with primary by next week if symptoms not improving  Kristian Covey MD [facility]

## 2016-04-07 NOTE — Patient Instructions (Signed)

## 2016-05-20 ENCOUNTER — Other Ambulatory Visit: Payer: Self-pay | Admitting: Family Medicine

## 2016-05-21 ENCOUNTER — Other Ambulatory Visit: Payer: Self-pay | Admitting: Family Medicine

## 2016-05-24 NOTE — Telephone Encounter (Signed)
Last refill #15 and OV was 04/07/2016  Please advise

## 2016-05-24 NOTE — Telephone Encounter (Signed)
Last OV and refill was 04/07/2016 #15 Please advise

## 2016-05-24 NOTE — Telephone Encounter (Signed)
Refill once 

## 2016-05-29 ENCOUNTER — Telehealth: Payer: Self-pay

## 2016-05-29 NOTE — Telephone Encounter (Signed)
I left a message for the pt to return my call. 

## 2016-05-29 NOTE — Telephone Encounter (Signed)
Refill request Zofran-OD 8 mg Last Ov: 04/07/16 for Diarrhea Last refill 05/24/16 and 05/25/16 Please advise

## 2016-05-29 NOTE — Telephone Encounter (Signed)
Should not still be needing this.  If nausea persists, needs follow up for further evaluation.

## 2016-06-05 NOTE — Telephone Encounter (Signed)
Medication denied in system with notes stating he needs to follow up.

## 2016-07-14 DIAGNOSIS — N46029 Azoospermia due to other extratesticular causes: Secondary | ICD-10-CM | POA: Diagnosis not present

## 2016-07-21 ENCOUNTER — Encounter: Payer: Self-pay | Admitting: Physician Assistant

## 2016-07-21 ENCOUNTER — Ambulatory Visit (INDEPENDENT_AMBULATORY_CARE_PROVIDER_SITE_OTHER): Payer: Federal, State, Local not specified - PPO | Admitting: Physician Assistant

## 2016-07-21 ENCOUNTER — Telehealth: Payer: Self-pay | Admitting: *Deleted

## 2016-07-21 VITALS — BP 96/72 | HR 65 | Temp 98.2°F | Resp 16 | Ht 70.0 in | Wt 200.5 lb

## 2016-07-21 DIAGNOSIS — Z0000 Encounter for general adult medical examination without abnormal findings: Secondary | ICD-10-CM

## 2016-07-21 DIAGNOSIS — Z23 Encounter for immunization: Secondary | ICD-10-CM | POA: Diagnosis not present

## 2016-07-21 DIAGNOSIS — K529 Noninfective gastroenteritis and colitis, unspecified: Secondary | ICD-10-CM | POA: Diagnosis not present

## 2016-07-21 MED ORDER — ELUXADOLINE 75 MG PO TABS: 75.0000 mg | ORAL_TABLET | Freq: Two times a day (BID) | ORAL | 1 refills | Status: DC

## 2016-07-21 MED ORDER — AZELASTINE HCL 0.1 % NA SOLN: 2.0000 | Freq: Two times a day (BID) | NASAL | 12 refills | Status: DC

## 2016-07-21 MED ORDER — DIAZEPAM 10 MG PO TABS: 5.0000 mg | ORAL_TABLET | Freq: Every evening | ORAL | 2 refills | Status: DC | PRN

## 2016-07-21 MED ORDER — ONDANSETRON 8 MG PO TBDP: ORAL_TABLET | ORAL | 3 refills | Status: DC

## 2016-07-21 NOTE — Telephone Encounter (Signed)
Received PA for Viberzi from pharmacy; Initiated, awaiting response/SLS 11/10

## 2016-07-21 NOTE — Progress Notes (Signed)
Patient presents to clinic today for annual exam.  Patient is not fasting for labs today. Body mass index is 28.77 kg/m. Patient does endorses trying to stay active.    Chronic Issues: Chronic Diarrhea -- Has had significant evaluation with GI including colonoscopy and multiple blood panels. Al unremarkable for a cause. Suspect IBS-D. Has tried Immodium over the counter with some success but does not like to take all of the time.   Health Maintenance: Immunizations -- Declines flu shot. Agrees to Tetanus shot today. Colonoscopy -- Patient with colonoscopy last year due to chronic diarrhea. HIV Screen -- Declines. Had last screen 2 years ago. Normal per patient.  Past Medical History:  Diagnosis Date  . Frequent headaches   . History of chicken pox   . Migraine   . Neuropathy (HCC)    Right foot    Past Surgical History:  Procedure Laterality Date  . pinched nerve foot     Left  . WISDOM TOOTH EXTRACTION      Current Outpatient Prescriptions on File Prior to Visit  Medication Sig Dispense Refill  . diazepam (VALIUM) 10 MG tablet Take 5-10 mg by mouth at bedtime as needed for sleep.    Marland Kitchen. ondansetron (ZOFRAN-ODT) 8 MG disintegrating tablet DISSOLVE 1 TABLET(8 MG) ON THE TONGUE EVERY 8 HOURS AS NEEDED FOR NAUSEA OR VOMITING 15 tablet 0   No current facility-administered medications on file prior to visit.     Allergies  Allergen Reactions  . Hydrocodone Nausea And Vomiting  . Oxycodone Nausea And Vomiting    Family History  Problem Relation Age of Onset  . Migraines Mother     Living  . Healthy Father     Living  . Heart failure Maternal Grandmother   . Healthy Daughter     x1  . Allergies Son     x1  . Stomach cancer      Great uncle  . Colon cancer Neg Hx     Social History   Social History  . Marital status: Married    Spouse name: N/A  . Number of children: 2  . Years of education: N/A   Occupational History  . Not on file.   Social History  Main Topics  . Smoking status: Former Smoker    Types: Cigarettes  . Smokeless tobacco: Never Used  . Alcohol use 0.0 oz/week     Comment: Very rare  . Drug use: Unknown  . Sexual activity: Not on file   Other Topics Concern  . Not on file   Social History Narrative   Pt lives with his wife. Lives in a three story homes, no issues with stairs. Some college   Review of Systems  Constitutional: Negative for fever and weight loss.  HENT: Negative for ear discharge, ear pain, hearing loss and tinnitus.   Eyes: Negative for blurred vision, double vision, photophobia and pain.  Respiratory: Negative for cough and shortness of breath.   Cardiovascular: Negative for chest pain and palpitations.  Gastrointestinal: Negative for abdominal pain, blood in stool, constipation, heartburn, melena, nausea and vomiting.       Chronic diarrhea  Genitourinary: Negative for dysuria, flank pain, frequency, hematuria and urgency.  Musculoskeletal: Negative for falls.  Neurological: Negative for dizziness, loss of consciousness and headaches.  Endo/Heme/Allergies: Negative for environmental allergies.  Psychiatric/Behavioral: Negative for depression, hallucinations, substance abuse and suicidal ideas. The patient is not nervous/anxious and does not have insomnia.    BP 96/72 (  BP Location: Left Arm, Patient Position: Sitting, Cuff Size: Large)   Pulse 65   Temp 98.2 F (36.8 C) (Oral)   Resp 16   Ht 5\' 10"  (1.778 m)   Wt 200 lb 8 oz (90.9 kg)   SpO2 99%   BMI 28.77 kg/m   Physical Exam  Constitutional: He is oriented to person, place, and time and well-developed, well-nourished, and in no distress.  HENT:  Head: Normocephalic and atraumatic.  Right Ear: External ear normal.  Left Ear: External ear normal.  Nose: Nose normal.  Mouth/Throat: Oropharynx is clear and moist. No oropharyngeal exudate.  Eyes: Conjunctivae and EOM are normal. Pupils are equal, round, and reactive to light.  Neck:  Neck supple. No thyromegaly present.  Cardiovascular: Normal rate, regular rhythm, normal heart sounds and intact distal pulses.   Pulmonary/Chest: Effort normal and breath sounds normal. No respiratory distress. He has no wheezes. He has no rales. He exhibits no tenderness.  Abdominal: Soft. Bowel sounds are normal. He exhibits no distension and no mass. There is no tenderness. There is no rebound and no guarding.  Genitourinary: Testes/scrotum normal.  Lymphadenopathy:    He has no cervical adenopathy.  Neurological: He is alert and oriented to person, place, and time.  Skin: Skin is warm and dry. No rash noted.  Psychiatric: Affect normal.  Vitals reviewed.  Assessment/Plan: 1. Visit for preventive health examination Depression screen negative. Health Maintenance reviewed -- Tetanus updated. Declines flu shot. Preventive schedule discussed and handout given in AVS. Patient to return for fasting labs.  - CBC; Future - Comprehensive metabolic panel; Future - Hemoglobin A1c; Future - Lipid panel; Future - PSA; Future - TSH; Future  2. Need for prophylactic vaccination with combined diphtheria-tetanus-pertussis (DTP) vaccine TDaP given by nursing staff. - Tdap vaccine greater than or equal to 7yo IM  3. Chronic diarrhea Will attempt trial of Viberzi for IBS-D.   Piedad ClimesMartin, Jennesis Ramaswamy Cody, PA-C

## 2016-07-21 NOTE — Progress Notes (Signed)
Pre visit review using our clinic review tool, if applicable. No additional management support is needed unless otherwise documented below in the visit note/SLS  

## 2016-07-21 NOTE — Patient Instructions (Signed)
Please schedule an appointment for fasting labs.  Our office will call you with your results unless you have chosen to receive results via MyChart.  If your blood work is normal we will follow-up each year for physicals and as scheduled for chronic medical problems.  If anything is abnormal we will treat accordingly and get you in for a follow-up.  Start the Astelin as directed to help with seasonal allergy symptoms.  Take the script for the Viberzi to the pharmacy for chronic diarrhea. We will likely have to fill out papers to get your insurance to pay but we will work on this if needed.  Preventive Care for Adults, Male A healthy lifestyle and preventive care can promote health and wellness. Preventive health guidelines for men include the following key practices:  A routine yearly physical is a good way to check with your health care provider about your health and preventative screening. It is a chance to share any concerns and updates on your health and to receive a thorough exam.  Visit your dentist for a routine exam and preventative care every 6 months. Brush your teeth twice a day and floss once a day. Good oral hygiene prevents tooth decay and gum disease.  The frequency of eye exams is based on your age, health, family medical history, use of contact lenses, and other factors. Follow your health care provider's recommendations for frequency of eye exams.  Eat a healthy diet. Foods such as vegetables, fruits, whole grains, low-fat dairy products, and lean protein foods contain the nutrients you need without too many calories. Decrease your intake of foods high in solid fats, added sugars, and salt. Eat the right amount of calories for you.Get information about a proper diet from your health care provider, if necessary.  Regular physical exercise is one of the most important things you can do for your health. Most adults should get at least 150 minutes of moderate-intensity exercise  (any activity that increases your heart rate and causes you to sweat) each week. In addition, most adults need muscle-strengthening exercises on 2 or more days a week.  Maintain a healthy weight. The body mass index (BMI) is a screening tool to identify possible weight problems. It provides an estimate of body fat based on height and weight. Your health care provider can find your BMI and can help you achieve or maintain a healthy weight.For adults 20 years and older:  A BMI below 18.5 is considered underweight.  A BMI of 18.5 to 24.9 is normal.  A BMI of 25 to 29.9 is considered overweight.  A BMI of 30 and above is considered obese.  Maintain normal blood lipids and cholesterol levels by exercising and minimizing your intake of saturated fat. Eat a balanced diet with plenty of fruit and vegetables. Blood tests for lipids and cholesterol should begin at age 20 and be repeated every 5 years. If your lipid or cholesterol levels are high, you are over 50, or you are at high risk for heart disease, you may need your cholesterol levels checked more frequently.Ongoing high lipid and cholesterol levels should be treated with medicines if diet and exercise are not working.  If you smoke, find out from your health care provider how to quit. If you do not use tobacco, do not start.  Lung cancer screening is recommended for adults aged 28-80 years who are at high risk for developing lung cancer because of a history of smoking. A yearly low-dose CT scan of the  lungs is recommended for people who have at least a 30-pack-year history of smoking and are a current smoker or have quit within the past 15 years. A pack year of smoking is smoking an average of 1 pack of cigarettes a day for 1 year (for example: 1 pack a day for 30 years or 2 packs a day for 15 years). Yearly screening should continue until the smoker has stopped smoking for at least 15 years. Yearly screening should be stopped for people who develop  a health problem that would prevent them from having lung cancer treatment.  If you choose to drink alcohol, do not have more than 2 drinks per day. One drink is considered to be 12 ounces (355 mL) of beer, 5 ounces (148 mL) of wine, or 1.5 ounces (44 mL) of liquor.  Avoid use of street drugs. Do not share needles with anyone. Ask for help if you need support or instructions about stopping the use of drugs.  High blood pressure causes heart disease and increases the risk of stroke. Your blood pressure should be checked at least every 1-2 years. Ongoing high blood pressure should be treated with medicines, if weight loss and exercise are not effective.  If you are 45-24 years old, ask your health care provider if you should take aspirin to prevent heart disease.  Diabetes screening is done by taking a blood sample to check your blood glucose level after you have not eaten for a certain period of time (fasting). If you are not overweight and you do not have risk factors for diabetes, you should be screened once every 3 years starting at age 19. If you are overweight or obese and you are 52-29 years of age, you should be screened for diabetes every year as part of your cardiovascular risk assessment.  Colorectal cancer can be detected and often prevented. Most routine colorectal cancer screening begins at the age of 59 and continues through age 20. However, your health care provider may recommend screening at an earlier age if you have risk factors for colon cancer. On a yearly basis, your health care provider may provide home test kits to check for hidden blood in the stool. Use of a small camera at the end of a tube to directly examine the colon (sigmoidoscopy or colonoscopy) can detect the earliest forms of colorectal cancer. Talk to your health care provider about this at age 5, when routine screening begins. Direct exam of the colon should be repeated every 5-10 years through age 25, unless early  forms of precancerous polyps or small growths are found.  People who are at an increased risk for hepatitis B should be screened for this virus. You are considered at high risk for hepatitis B if:  You were born in a country where hepatitis B occurs often. Talk with your health care provider about which countries are considered high risk.  Your parents were born in a high-risk country and you have not received a shot to protect against hepatitis B (hepatitis B vaccine).  You have HIV or AIDS.  You use needles to inject street drugs.  You live with, or have sex with, someone who has hepatitis B.  You are a man who has sex with other men (MSM).  You get hemodialysis treatment.  You take certain medicines for conditions such as cancer, organ transplantation, and autoimmune conditions.  Hepatitis C blood testing is recommended for all people born from [birth year] through 1965 and any individual with  known risks for hepatitis C.  Practice safe sex. Use condoms and avoid high-risk sexual practices to reduce the spread of sexually transmitted infections (STIs). STIs include gonorrhea, chlamydia, syphilis, trichomonas, herpes, HPV, and human immunodeficiency virus (HIV). Herpes, HIV, and HPV are viral illnesses that have no cure. They can result in disability, cancer, and death.  If you are a man who has sex with other men, you should be screened at least once per year for:  HIV.  Urethral, rectal, and pharyngeal infection of gonorrhea, chlamydia, or both.  If you are at risk of being infected with HIV, it is recommended that you take a prescription medicine daily to prevent HIV infection. This is called preexposure prophylaxis (PrEP). You are considered at risk if:  You are a man who has sex with other men (MSM) and have other risk factors.  You are a heterosexual man, are sexually active, and are at increased risk for HIV infection.  You take drugs by injection.  You are sexually active with  a partner who has HIV.  Talk with your health care provider about whether you are at high risk of being infected with HIV. If you choose to begin PrEP, you should first be tested for HIV. You should then be tested every 3 months for as long as you are taking PrEP.  A one-time screening for abdominal aortic aneurysm (AAA) and surgical repair of large AAAs by ultrasound are recommended for men ages 31 to 99 years who are current or former smokers.  Healthy men should no longer receive prostate-specific antigen (PSA) blood tests as part of routine cancer screening. Talk with your health care provider about prostate cancer screening.  Testicular cancer screening is not recommended for adult males who have no symptoms. Screening includes self-exam, a health care provider exam, and other screening tests. Consult with your health care provider about any symptoms you have or any concerns you have about testicular cancer.  Use sunscreen. Apply sunscreen liberally and repeatedly throughout the day. You should seek shade when your shadow is shorter than you. Protect yourself by wearing long sleeves, pants, a wide-brimmed hat, and sunglasses year round, whenever you are outdoors.  Once a month, do a whole-body skin exam, using a mirror to look at the skin on your back. Tell your health care provider about new moles, moles that have irregular borders, moles that are larger than a pencil eraser, or moles that have changed in shape or color.  Stay current with required vaccines (immunizations).  Influenza vaccine. All adults should be immunized every year.  Tetanus, diphtheria, and acellular pertussis (Td, Tdap) vaccine. An adult who has not previously received Tdap or who does not know his vaccine status should receive 1 dose of Tdap. This initial dose should be followed by tetanus and diphtheria toxoids (Td) booster doses every 10 years. Adults with an unknown or incomplete history of completing a 3-dose  immunization series with Td-containing vaccines should begin or complete a primary immunization series including a Tdap dose. Adults should receive a Td booster every 10 years.  Varicella vaccine. An adult without evidence of immunity to varicella should receive 2 doses or a second dose if he has previously received 1 dose.  Human papillomavirus (HPV) vaccine. Males aged 11-21 years who have not received the vaccine previously should receive the 3-dose series. Males aged 22-26 years may be immunized. Immunization is recommended through the age of 33 years for any male who has sex with males and did  not get any or all doses earlier. Immunization is recommended for any person with an immunocompromised condition through the age of 53 years if he did not get any or all doses earlier. During the 3-dose series, the second dose should be obtained 4-8 weeks after the first dose. The third dose should be obtained 24 weeks after the first dose and 16 weeks after the second dose.  Zoster vaccine. One dose is recommended for adults aged 9 years or older unless certain conditions are present.  Measles, mumps, and rubella (MMR) vaccine. Adults born before [birth year] generally are considered immune to measles and mumps. Adults born in [birth year] or later should have 1 or more doses of MMR vaccine unless there is a contraindication to the vaccine or there is laboratory evidence of immunity to each of the three diseases. A routine second dose of MMR vaccine should be obtained at least 28 days after the first dose for students attending postsecondary schools, health care workers, or international travelers. People who received inactivated measles vaccine or an unknown type of measles vaccine during 1963-1967 should receive 2 doses of MMR vaccine. People who received inactivated mumps vaccine or an unknown type of mumps vaccine before 1979 and are at high risk for mumps infection should consider immunization with 2 doses of MMR vaccine.  Unvaccinated health care workers born before [birth year] who lack laboratory evidence of measles, mumps, or rubella immunity or laboratory confirmation of disease should consider measles and mumps immunization with 2 doses of MMR vaccine or rubella immunization with 1 dose of MMR vaccine.  Pneumococcal 13-valent conjugate (PCV13) vaccine. When indicated, a person who is uncertain of his immunization history and has no record of immunization should receive the PCV13 vaccine. All adults 15 years of age and older should receive this vaccine. An adult aged 73 years or older who has certain medical conditions and has not been previously immunized should receive 1 dose of PCV13 vaccine. This PCV13 should be followed with a dose of pneumococcal polysaccharide (PPSV23) vaccine. Adults who are at high risk for pneumococcal disease should obtain the PPSV23 vaccine at least 8 weeks after the dose of PCV13 vaccine. Adults older than 43 years of age who have normal immune system function should obtain the PPSV23 vaccine dose at least 1 year after the dose of PCV13 vaccine.  Pneumococcal polysaccharide (PPSV23) vaccine. When PCV13 is also indicated, PCV13 should be obtained first. All adults aged 36 years and older should be immunized. An adult younger than age 6 years who has certain medical conditions should be immunized. Any person who resides in a nursing home or long-term care facility should be immunized. An adult smoker should be immunized. People with an immunocompromised condition and certain other conditions should receive both PCV13 and PPSV23 vaccines. People with human immunodeficiency virus (HIV) infection should be immunized as soon as possible after diagnosis. Immunization during chemotherapy or radiation therapy should be avoided. Routine use of PPSV23 vaccine is not recommended for American Indians, [place] Natives, or people younger than 65 years unless there are medical conditions that require PPSV23 vaccine.  When indicated, people who have unknown immunization and have no record of immunization should receive PPSV23 vaccine. One-time revaccination 5 years after the first dose of PPSV23 is recommended for people aged 19-64 years who have chronic kidney failure, nephrotic syndrome, asplenia, or immunocompromised conditions. People who received 1-2 doses of PPSV23 before age 66 years should receive another dose of PPSV23 vaccine at age 3 years or later  if at least 5 years have passed since the previous dose. Doses of PPSV23 are not needed for people immunized with PPSV23 at or after age 21 years.  Meningococcal vaccine. Adults with asplenia or persistent complement component deficiencies should receive 2 doses of quadrivalent meningococcal conjugate (MenACWY-D) vaccine. The doses should be obtained at least 2 months apart. Microbiologists working with certain meningococcal bacteria, [employer] recruits, people at risk during an outbreak, and people who travel to or live in countries with a high rate of meningitis should be immunized. A first-year college student up through age 79 years who is living in a residence hall should receive a dose if he did not receive a dose on or after his 16th birthday. Adults who have certain high-risk conditions should receive one or more doses of vaccine.  Hepatitis A vaccine. Adults who wish to be protected from this disease, have chronic liver disease, work with hepatitis A-infected animals, work in hepatitis A research labs, or travel to or work in countries with a high rate of hepatitis A should be immunized. Adults who were previously unvaccinated and who anticipate close contact with an international adoptee during the first 60 days after arrival in the [place] from a country with a high rate of hepatitis A should be immunized.  Hepatitis B vaccine. Adults should be immunized if they wish to be protected from this disease, are under age 55 years and have diabetes, have  chronic liver disease, have had more than one sex partner in the past 6 months, may be exposed to blood or other infectious body fluids, are household contacts or sex partners of hepatitis B positive people, are clients or workers in certain care facilities, or travel to or work in countries with a high rate of hepatitis B.  Haemophilus influenzae type b (Hib) vaccine. A previously unvaccinated person with asplenia or sickle cell disease or having a scheduled splenectomy should receive 1 dose of Hib vaccine. Regardless of previous immunization, a recipient of a hematopoietic stem cell transplant should receive a 3-dose series 6-12 months after his successful transplant. Hib vaccine is not recommended for adults with HIV infection. Preventive Service / Frequency Ages 53 to 74  Blood pressure check.** / Every 3-5 years.  Lipid and cholesterol check.** / Every 5 years beginning at age 79.  Hepatitis C blood test.** / For any individual with known risks for hepatitis C.  Skin self-exam. / Monthly.  Influenza vaccine. / Every year.  Tetanus, diphtheria, and acellular pertussis (Tdap, Td) vaccine.** / Consult your health care provider. 1 dose of Td every 10 years.  Varicella vaccine.** / Consult your health care provider.  HPV vaccine. / 3 doses over 6 months, if 31 or younger.  Measles, mumps, rubella (MMR) vaccine.** / You need at least 1 dose of MMR if you were born in 1957 or later. You may also need a second dose.  Pneumococcal 13-valent conjugate (PCV13) vaccine.** / Consult your health care provider.  Pneumococcal polysaccharide (PPSV23) vaccine.** / 1 to 2 doses if you smoke cigarettes or if you have certain conditions.  Meningococcal vaccine.** / 1 dose if you are age 5 to 108 years and a Market researcher living in a residence hall, or have one of several medical conditions. You may also need additional booster doses.  Hepatitis A vaccine.** / Consult your health care  provider.  Hepatitis B vaccine.** / Consult your health care provider.  Haemophilus influenzae type b (Hib) vaccine.** / Consult your health care  provider. Ages 38 to 65  Blood pressure check.** / Every year.  Lipid and cholesterol check.** / Every 5 years beginning at age 66.  Lung cancer screening. / Every year if you are aged 8-80 years and have a 30-pack-year history of smoking and currently smoke or have quit within the past 15 years. Yearly screening is stopped once you have quit smoking for at least 15 years or develop a health problem that would prevent you from having lung cancer treatment.  Fecal occult blood test (FOBT) of stool. / Every year beginning at age 42 and continuing until age 24. You may not have to do this test if you get a colonoscopy every 10 years.  Flexible sigmoidoscopy** or colonoscopy.** / Every 5 years for a flexible sigmoidoscopy or every 10 years for a colonoscopy beginning at age 8 and continuing until age 84.  Hepatitis C blood test.** / For all people born from [birth year] through 1965 and any individual with known risks for hepatitis C.  Skin self-exam. / Monthly.  Influenza vaccine. / Every year.  Tetanus, diphtheria, and acellular pertussis (Tdap/Td) vaccine.** / Consult your health care provider. 1 dose of Td every 10 years.  Varicella vaccine.** / Consult your health care provider.  Zoster vaccine.** / 1 dose for adults aged 79 years or older.  Measles, mumps, rubella (MMR) vaccine.** / You need at least 1 dose of MMR if you were born in 1955 or later. You may also need a second dose.  Pneumococcal 13-valent conjugate (PCV13) vaccine.** / Consult your health care provider.  Pneumococcal polysaccharide (PPSV23) vaccine.** / 1 to 2 doses if you smoke cigarettes or if you have certain conditions.  Meningococcal vaccine.** / Consult your health care provider.  Hepatitis A vaccine.** / Consult your health care provider.  Hepatitis B vaccine.** /  Consult your health care provider.  Haemophilus influenzae type b (Hib) vaccine.** / Consult your health care provider. Ages 59 and over  Blood pressure check.** / Every year.  Lipid and cholesterol check.**/ Every 5 years beginning at age 64.  Lung cancer screening. / Every year if you are aged 3-80 years and have a 30-pack-year history of smoking and currently smoke or have quit within the past 15 years. Yearly screening is stopped once you have quit smoking for at least 15 years or develop a health problem that would prevent you from having lung cancer treatment.  Fecal occult blood test (FOBT) of stool. / Every year beginning at age 74 and continuing until age 24. You may not have to do this test if you get a colonoscopy every 10 years.  Flexible sigmoidoscopy** or colonoscopy.** / Every 5 years for a flexible sigmoidoscopy or every 10 years for a colonoscopy beginning at age 79 and continuing until age 93.  Hepatitis C blood test.** / For all people born from [birth year] through 1963 and any individual with known risks for hepatitis C.  Abdominal aortic aneurysm (AAA) screening.** / A one-time screening for ages 80 to 45 years who are current or former smokers.  Skin self-exam. / Monthly.  Influenza vaccine. / Every year.  Tetanus, diphtheria, and acellular pertussis (Tdap/Td) vaccine.** / 1 dose of Td every 10 years.  Varicella vaccine.** / Consult your health care provider.  Zoster vaccine.** / 1 dose for adults aged 56 years or older.  Pneumococcal 13-valent conjugate (PCV13) vaccine.** / 1 dose for all adults aged 28 years and older.  Pneumococcal polysaccharide (PPSV23) vaccine.** / 1 dose for all adults  aged 95 years and older.  Meningococcal vaccine.** / Consult your health care provider.  Hepatitis A vaccine.** / Consult your health care provider.  Hepatitis B vaccine.** / Consult your health care provider.  Haemophilus influenzae type b (Hib) vaccine.** / Consult your  health care provider. **Family history and personal history of risk and conditions may change your health care provider's recommendations.   This information is not intended to replace advice given to you by your health care provider. Make sure you discuss any questions you have with your health care provider.   Document Released: 10/24/2001 Document Revised: 09/18/2014 Document Reviewed: 01/23/2011 Elsevier Interactive Patient Education [employer].

## 2016-07-24 NOTE — Telephone Encounter (Signed)
Please inform patient that we are awaiting response. Thank you.

## 2016-07-24 NOTE — Telephone Encounter (Signed)
I am having Patina repeat the PA. IBS-D is a diagnosis of exclusion. He has had negative workup excluding other causes so we can use DX of IBS-D.

## 2016-07-24 NOTE — Telephone Encounter (Signed)
PA for Viberzi was Denied, patient would have to have a diagnosis of Irritable Bowel Syndrome or related with Diarrhea. Reviewed GI progress notes, lab results and colonoscopy results and there is no evidence of IBS [all test were normal]. Patient has been informed & understood. Please Advise/SLS 11/13

## 2016-07-25 ENCOUNTER — Other Ambulatory Visit (INDEPENDENT_AMBULATORY_CARE_PROVIDER_SITE_OTHER): Payer: Federal, State, Local not specified - PPO

## 2016-07-25 DIAGNOSIS — Z0000 Encounter for general adult medical examination without abnormal findings: Secondary | ICD-10-CM | POA: Diagnosis not present

## 2016-07-25 LAB — COMPREHENSIVE METABOLIC PANEL
ALBUMIN: 4.8 g/dL (ref 3.5–5.2)
ALK PHOS: 60 U/L (ref 39–117)
ALT: 30 U/L (ref 0–53)
AST: 18 U/L (ref 0–37)
BILIRUBIN TOTAL: 1 mg/dL (ref 0.2–1.2)
BUN: 19 mg/dL (ref 6–23)
CALCIUM: 9.8 mg/dL (ref 8.4–10.5)
CHLORIDE: 102 meq/L (ref 96–112)
CO2: 28 mEq/L (ref 19–32)
CREATININE: 1.14 mg/dL (ref 0.40–1.50)
GFR: 74.56 mL/min (ref >60.00)
Glucose, Bld: 93 mg/dL (ref 70–99)
Potassium: 3.7 mEq/L (ref 3.5–5.1)
Sodium: 139 mEq/L (ref 135–145)
TOTAL PROTEIN: 7.4 g/dL (ref 6.0–8.3)

## 2016-07-25 LAB — HEMOGLOBIN A1C: HEMOGLOBIN A1C: 5.6 % (ref 4.6–6.5)

## 2016-07-25 LAB — TSH: TSH: 1.17 u[IU]/mL (ref 0.35–4.50)

## 2016-07-25 LAB — LIPID PANEL
CHOLESTEROL: 194 mg/dL (ref 0–200)
HDL: 43.9 mg/dL (ref >39.00)
LDL Cholesterol: 130 mg/dL — ABNORMAL HIGH (ref 0–99)
NONHDL: 150.4
Total CHOL/HDL Ratio: 4
Triglycerides: 102 mg/dL (ref 0.0–149.0)
VLDL: 20.4 mg/dL (ref 0.0–40.0)

## 2016-07-25 LAB — CBC
HCT: 45.3 % (ref 39.0–52.0)
Hemoglobin: 15.4 g/dL (ref 13.0–17.0)
MCHC: 34.1 g/dL (ref 30.0–36.0)
MCV: 86.8 fl (ref 78.0–100.0)
PLATELETS: 237 10*3/uL (ref 150.0–400.0)
RBC: 5.22 Mil/uL (ref 4.22–5.81)
RDW: 13.8 % (ref 11.5–15.5)
WBC: 8.3 10*3/uL (ref 4.0–10.5)

## 2016-07-25 LAB — PSA: PSA: 0.27 ng/mL (ref 0.10–4.00)

## 2016-07-27 NOTE — Telephone Encounter (Signed)
PA resent thru Cover My Meds.  Received approval for Viberzi 75 mg. Authorized from 05/27/16-10/24/2016.  Notified patient and pharmacy.

## 2016-09-14 ENCOUNTER — Encounter: Payer: Self-pay | Admitting: Physician Assistant

## 2016-09-14 ENCOUNTER — Other Ambulatory Visit: Payer: Self-pay | Admitting: Physician Assistant

## 2016-09-14 ENCOUNTER — Other Ambulatory Visit: Payer: Self-pay | Admitting: Emergency Medicine

## 2016-09-14 MED ORDER — ELUXADOLINE 75 MG PO TABS: 1.0000 | ORAL_TABLET | Freq: Two times a day (BID) | ORAL | 3 refills | Status: DC

## 2016-09-15 NOTE — Telephone Encounter (Signed)
Forwarding back to you.  He needs to pick up a Viberzi coupon here to make 30 bucks for 90 day supply.

## 2016-11-09 ENCOUNTER — Encounter: Payer: Self-pay | Admitting: Physician Assistant

## 2016-11-10 ENCOUNTER — Encounter: Payer: Self-pay | Admitting: Physician Assistant

## 2016-11-10 ENCOUNTER — Ambulatory Visit (INDEPENDENT_AMBULATORY_CARE_PROVIDER_SITE_OTHER): Payer: Federal, State, Local not specified - PPO | Admitting: Physician Assistant

## 2016-11-10 VITALS — BP 118/78 | HR 83 | Temp 98.1°F | Resp 14 | Ht 70.0 in | Wt 206.0 lb

## 2016-11-10 DIAGNOSIS — M545 Low back pain, unspecified: Secondary | ICD-10-CM

## 2016-11-10 MED ORDER — MELOXICAM 15 MG PO TABS
15.0000 mg | ORAL_TABLET | Freq: Every day | ORAL | 0 refills | Status: DC
Start: 2016-11-10 — End: 2017-03-12

## 2016-11-10 MED ORDER — TIZANIDINE HCL 4 MG PO CAPS: 4.0000 mg | ORAL_CAPSULE | Freq: Three times a day (TID) | ORAL | 0 refills | Status: DC

## 2016-11-10 MED ORDER — KETOROLAC TROMETHAMINE 60 MG/2ML IM SOLN
60.0000 mg | Freq: Once | INTRAMUSCULAR | Status: AC
  Administered 2016-11-10: 60 mg via INTRAMUSCULAR

## 2016-11-10 NOTE — Progress Notes (Signed)
Patient presents to clinic today c/o bilateral low back pain starting after an episode of bending and heavy lifting this week.  Pain is worse with range of motions, alleviated with prolonged sitting or laying down. Denies radiation symptoms, saddle anesthesia or change to bowel/bladder habits. Has taken some Tramadol with little relief in pain.    Past Medical History:  Diagnosis Date  . Frequent headaches   . History of chicken pox   . Migraine   . Neuropathy (HCC)    Right foot    Current Outpatient Prescriptions on File Prior to Visit  Medication Sig Dispense Refill  . azelastine (ASTELIN) 0.1 % nasal spray Place 2 sprays into both nostrils 2 (two) times daily. Use in each nostril as directed 30 mL 12  . diazepam (VALIUM) 10 MG tablet Take 0.5-1 tablets (5-10 mg total) by mouth at bedtime as needed for sleep. 30 tablet 2  . Eluxadoline (VIBERZI) 75 MG TABS Take 1 tablet by mouth 2 (two) times daily. 60 tablet 3  . ondansetron (ZOFRAN-ODT) 8 MG disintegrating tablet DISSOLVE 1 TABLET(8 MG) ON THE TONGUE EVERY 8 HOURS AS NEEDED FOR NAUSEA OR VOMITING 20 tablet 3   No current facility-administered medications on file prior to visit.     Allergies  Allergen Reactions  . Hydrocodone Nausea And Vomiting  . Oxycodone Nausea And Vomiting    Family History  Problem Relation Age of Onset  . Migraines Mother     Living  . Healthy Father     Living  . Heart failure Maternal Grandmother   . Healthy Daughter     x1  . Allergies Son     x1  . Stomach cancer      Great uncle  . Colon cancer Neg Hx     Social History   Social History  . Marital status: Married    Spouse name: N/A  . Number of children: 2  . Years of education: N/A   Social History Main Topics  . Smoking status: Former Smoker    Types: Cigarettes  . Smokeless tobacco: Never Used  . Alcohol use 0.0 oz/week     Comment: Very rare  . Drug use: Unknown  . Sexual activity: Not Asked   Other Topics Concern    . None   Social History Narrative   Pt lives with his wife. Lives in a three story homes, no issues with stairs. Some college    Review of Systems - See HPI.  All other ROS are negative.  BP 118/78   Pulse 83   Temp 98.1 F (36.7 C) (Oral)   Resp 14   Ht 5\' 10"  (1.778 m)   Wt 206 lb (93.4 kg)   SpO2 97%   BMI 29.56 kg/m   Physical Exam  Constitutional: He is well-developed, well-nourished, and in no distress.  HENT:  Head: Normocephalic and atraumatic.  Eyes: Conjunctivae are normal.  Cardiovascular: Normal rate, regular rhythm, normal heart sounds and intact distal pulses.   Pulmonary/Chest: Effort normal and breath sounds normal.  Musculoskeletal:       Thoracic back: Normal.       Lumbar back: He exhibits tenderness. He exhibits normal range of motion, no bony tenderness and no pain.  Vitals reviewed.  Assessment/Plan: 1. Acute midline low back pain without sciatica IM toradol given. Start tylenol arthritis as directed. Rx Tizanidine for muscle spasms. Supportive measures and stretching exercises reviewed with patient.  - ketorolac (TORADOL) injection 60 mg; Inject  2 mLs (60 mg total) into the muscle once.   Piedad ClimesMartin, Terecia Plaut Cody, PA-C

## 2016-11-10 NOTE — Patient Instructions (Signed)
No heavy lifting. Apply heating pad to the area for 10-15 minutes, 3 x day.  Take Mobic as directed with food once daily. Start tomorrow. Tylenol if needed for breakthrough pain. Use the Tizanidine as directed. Do not drive while on medications.  Follow-up if symptoms are not improving over the weekend.   Low Back Sprain Rehab Ask your health care provider which exercises are safe for you. Do exercises exactly as told by your health care provider and adjust them as directed. It is normal to feel mild stretching, pulling, tightness, or discomfort as you do these exercises, but you should stop right away if you feel sudden pain or your pain gets worse. Do not begin these exercises until told by your health care provider. Stretching and range of motion exercises These exercises warm up your muscles and joints and improve the movement and flexibility of your back. These exercises also help to relieve pain, numbness, and tingling. Exercise A: Lumbar rotation   1. Lie on your back on a firm surface and bend your knees. 2. Straighten your arms out to your sides so each arm forms an "L" shape with a side of your body (a 90 degree angle). 3. Slowly move both of your knees to one side of your body until you feel a stretch in your lower back. Try not to let your shoulders move off of the floor. 4. Hold for __________ seconds. 5. Tense your abdominal muscles and slowly move your knees back to the starting position. 6. Repeat this exercise on the other side of your body. Repeat __________ times. Complete this exercise __________ times a day. Exercise B: Prone extension on elbows   1. Lie on your abdomen on a firm surface. 2. Prop yourself up on your elbows. 3. Use your arms to help lift your chest up until you feel a gentle stretch in your abdomen and your lower back.  This will place some of your body weight on your elbows. If this is uncomfortable, try stacking pillows under your chest.  Your hips  should stay down, against the surface that you are lying on. Keep your hip and back muscles relaxed. 4. Hold for __________ seconds. 5. Slowly relax your upper body and return to the starting position. Repeat __________ times. Complete this exercise __________ times a day. Strengthening exercises These exercises build strength and endurance in your back. Endurance is the ability to use your muscles for a long time, even after they get tired. Exercise C: Pelvic tilt  1. Lie on your back on a firm surface. Bend your knees and keep your feet flat. 2. Tense your abdominal muscles. Tip your pelvis up toward the ceiling and flatten your lower back into the floor.  To help with this exercise, you may place a small towel under your lower back and try to push your back into the towel. 3. Hold for __________ seconds. 4. Let your muscles relax completely before you repeat this exercise. Repeat __________ times. Complete this exercise __________ times a day. Exercise D: Alternating arm and leg raises   1. Get on your hands and knees on a firm surface. If you are on a hard floor, you may want to use padding to cushion your knees, such as an exercise mat. 2. Line up your arms and legs. Your hands should be below your shoulders, and your knees should be below your hips. 3. Lift your left leg behind you. At the same time, raise your right arm and straighten  it in front of you.  Do not lift your leg higher than your hip.  Do not lift your arm higher than your shoulder.  Keep your abdominal and back muscles tight.  Keep your hips facing the ground.  Do not arch your back.  Keep your balance carefully, and do not hold your breath. 4. Hold for __________ seconds. 5. Slowly return to the starting position and repeat with your right leg and your left arm. Repeat __________ times. Complete this exercise __________ times a day. Exercise E: Abdominal set with straight leg raise   1. Lie on your back on a  firm surface. 2. Bend one of your knees and keep your other leg straight. 3. Tense your abdominal muscles and lift your straight leg up, 4-6 inches (10-15 cm) off the ground. 4. Keep your abdominal muscles tight and hold for __________ seconds.  Do not hold your breath.  Do not arch your back. Keep it flat against the ground. 5. Keep your abdominal muscles tense as you slowly lower your leg back to the starting position. 6. Repeat with your other leg. Repeat __________ times. Complete this exercise __________ times a day. Posture and body mechanics   Body mechanics refers to the movements and positions of your body while you do your daily activities. Posture is part of body mechanics. Good posture and healthy body mechanics can help to relieve stress in your body's tissues and joints. Good posture means that your spine is in its natural S-curve position (your spine is neutral), your shoulders are pulled back slightly, and your head is not tipped forward. The following are general guidelines for applying improved posture and body mechanics to your everyday activities. Standing    When standing, keep your spine neutral and your feet about hip-width apart. Keep a slight bend in your knees. Your ears, shoulders, and hips should line up.  When you do a task in which you stand in one place for a long time, place one foot up on a stable object that is 2-4 inches (5-10 cm) high, such as a footstool. This helps keep your spine neutral. Sitting    When sitting, keep your spine neutral and keep your feet flat on the floor. Use a footrest, if necessary, and keep your thighs parallel to the floor. Avoid rounding your shoulders, and avoid tilting your head forward.  When working at a desk or a computer, keep your desk at a height where your hands are slightly lower than your elbows. Slide your chair under your desk so you are close enough to maintain good posture.  When working at a computer, place your  monitor at a height where you are looking straight ahead and you do not have to tilt your head forward or downward to look at the screen. Resting    When lying down and resting, avoid positions that are most painful for you.  If you have pain with activities such as sitting, bending, stooping, or squatting (flexion-based activities), lie in a position in which your body does not bend very much. For example, avoid curling up on your side with your arms and knees near your chest (fetal position).  If you have pain with activities such as standing for a long time or reaching with your arms (extension-based activities), lie with your spine in a neutral position and bend your knees slightly. Try the following positions:  Lying on your side with a pillow between your knees.  Lying on your back with  a pillow under your knees. Lifting    When lifting objects, keep your feet at least shoulder-width apart and tighten your abdominal muscles.  Bend your knees and hips and keep your spine neutral. It is important to lift using the strength of your legs, not your back. Do not lock your knees straight out.  Always ask for help to lift heavy or awkward objects. This information is not intended to replace advice given to you by your health care provider. Make sure you discuss any questions you have with your health care provider. Document Released: 08/28/2005 Document Revised: 05/04/2016 Document Reviewed: 06/09/2015 Elsevier Interactive Patient Education  2017 [employer].

## 2016-11-10 NOTE — Progress Notes (Signed)
Pre visit review using our clinic review tool, if applicable. No additional management support is needed unless otherwise documented below in the visit note. 

## 2016-11-21 ENCOUNTER — Encounter: Payer: Self-pay | Admitting: Physician Assistant

## 2016-11-23 ENCOUNTER — Telehealth: Payer: Self-pay | Admitting: *Deleted

## 2016-11-23 NOTE — Telephone Encounter (Signed)
Medication approved through 02/21/2017  Information faxed to pharmacy

## 2016-11-23 NOTE — Telephone Encounter (Signed)
PA started through Cover My Meds.    Key: [id number] Awaiting reply from insurance company.

## 2017-02-06 ENCOUNTER — Other Ambulatory Visit: Payer: Self-pay | Admitting: Physician Assistant

## 2017-02-06 DIAGNOSIS — Z630 Problems in relationship with spouse or partner: Secondary | ICD-10-CM | POA: Diagnosis not present

## 2017-02-06 DIAGNOSIS — Z569 Unspecified problems related to employment: Secondary | ICD-10-CM | POA: Diagnosis not present

## 2017-02-06 DIAGNOSIS — F322 Major depressive disorder, single episode, severe without psychotic features: Secondary | ICD-10-CM | POA: Diagnosis not present

## 2017-02-06 DIAGNOSIS — Z6911 Encounter for mental health services for victim of spousal or partner abuse: Secondary | ICD-10-CM | POA: Diagnosis not present

## 2017-02-08 ENCOUNTER — Encounter: Payer: Self-pay | Admitting: Emergency Medicine

## 2017-02-08 NOTE — Telephone Encounter (Signed)
Mychart message sent to patient to schedule an appointment.

## 2017-02-08 NOTE — Telephone Encounter (Signed)
Last OV 11/10/16 Valium last filled 07/21/16 #30 with 2  No csc, or uds

## 2017-02-09 ENCOUNTER — Ambulatory Visit: Payer: Federal, State, Local not specified - PPO | Admitting: Physician Assistant

## 2017-02-12 DIAGNOSIS — Z630 Problems in relationship with spouse or partner: Secondary | ICD-10-CM | POA: Diagnosis not present

## 2017-02-12 DIAGNOSIS — F322 Major depressive disorder, single episode, severe without psychotic features: Secondary | ICD-10-CM | POA: Diagnosis not present

## 2017-02-12 DIAGNOSIS — Z569 Unspecified problems related to employment: Secondary | ICD-10-CM | POA: Diagnosis not present

## 2017-02-19 DIAGNOSIS — Z6911 Encounter for mental health services for victim of spousal or partner abuse: Secondary | ICD-10-CM | POA: Diagnosis not present

## 2017-02-19 DIAGNOSIS — Z630 Problems in relationship with spouse or partner: Secondary | ICD-10-CM | POA: Diagnosis not present

## 2017-02-19 DIAGNOSIS — F322 Major depressive disorder, single episode, severe without psychotic features: Secondary | ICD-10-CM | POA: Diagnosis not present

## 2017-02-19 DIAGNOSIS — Z6912 Encounter for mental health services for perpetrator of spousal or partner abuse: Secondary | ICD-10-CM | POA: Diagnosis not present

## 2017-02-26 DIAGNOSIS — F322 Major depressive disorder, single episode, severe without psychotic features: Secondary | ICD-10-CM | POA: Diagnosis not present

## 2017-02-26 DIAGNOSIS — Z630 Problems in relationship with spouse or partner: Secondary | ICD-10-CM | POA: Diagnosis not present

## 2017-02-26 DIAGNOSIS — Z569 Unspecified problems related to employment: Secondary | ICD-10-CM | POA: Diagnosis not present

## 2017-02-26 DIAGNOSIS — Z653 Problems related to other legal circumstances: Secondary | ICD-10-CM | POA: Diagnosis not present

## 2017-03-05 DIAGNOSIS — F322 Major depressive disorder, single episode, severe without psychotic features: Secondary | ICD-10-CM | POA: Diagnosis not present

## 2017-03-05 DIAGNOSIS — Z630 Problems in relationship with spouse or partner: Secondary | ICD-10-CM | POA: Diagnosis not present

## 2017-03-05 DIAGNOSIS — Z569 Unspecified problems related to employment: Secondary | ICD-10-CM | POA: Diagnosis not present

## 2017-03-12 ENCOUNTER — Encounter: Payer: Self-pay | Admitting: Physician Assistant

## 2017-03-12 ENCOUNTER — Ambulatory Visit (HOSPITAL_BASED_OUTPATIENT_CLINIC_OR_DEPARTMENT_OTHER)
Admission: RE | Admit: 2017-03-12 | Discharge: 2017-03-12 | Disposition: A | Discharge status: Home / Self Care | Payer: Federal, State, Local not specified - PPO | Source: Ambulatory Visit | Attending: Physician Assistant | Admitting: Physician Assistant

## 2017-03-12 ENCOUNTER — Ambulatory Visit (INDEPENDENT_AMBULATORY_CARE_PROVIDER_SITE_OTHER): Payer: Federal, State, Local not specified - PPO | Admitting: Physician Assistant

## 2017-03-12 VITALS — BP 118/72 | HR 69 | Temp 98.8°F | Resp 14 | Ht 70.0 in | Wt 201.0 lb

## 2017-03-12 DIAGNOSIS — R519 Headache, unspecified: Secondary | ICD-10-CM

## 2017-03-12 DIAGNOSIS — R51 Headache: Secondary | ICD-10-CM | POA: Diagnosis not present

## 2017-03-12 MED ORDER — MECLIZINE HCL 25 MG PO TABS: 25.0000 mg | ORAL_TABLET | Freq: Three times a day (TID) | ORAL | 0 refills | Status: DC | PRN

## 2017-03-12 MED ORDER — SUMATRIPTAN SUCCINATE 25 MG PO TABS: 25.0000 mg | ORAL_TABLET | Freq: Once | ORAL | 0 refills | Status: DC

## 2017-03-12 NOTE — Progress Notes (Signed)
Patient presents to clinic today c/o headache starting yesterday, throbbing in nature and about 5-6/10 scale. Endorses headache was unilaterally L-sided until last night when it switched sides. Notes some photophobia and phonophobia. Hydrating well. Endorses significant change to diet this week -- switched diet just fruits, nuts, berries. No caffeine in the past few days.  Patient with history of migraines. States this feels similar but he typically does not have the vertigo. Denies change to vision or AMS. Denies focal weakness. States Excedrin did not help this morning.   Past Medical History:  Diagnosis Date  . Frequent headaches   . History of chicken pox   . Migraine   . Neuropathy    Right foot    Current Outpatient Prescriptions on File Prior to Visit  Medication Sig Dispense Refill  . azelastine (ASTELIN) 0.1 % nasal spray Place 2 sprays into both nostrils 2 (two) times daily. Use in each nostril as directed 30 mL 12  . Eluxadoline (VIBERZI) 75 MG TABS Take 1 tablet by mouth 2 (two) times daily. 60 tablet 3  . ondansetron (ZOFRAN-ODT) 8 MG disintegrating tablet DISSOLVE 1 TABLET(8 MG) ON THE TONGUE EVERY 8 HOURS AS NEEDED FOR NAUSEA OR VOMITING 20 tablet 3  . meloxicam (MOBIC) 15 MG tablet Take 1 tablet (15 mg total) by mouth daily. (Patient not taking: Reported on 03/12/2017) 10 tablet 0  . tiZANidine (ZANAFLEX) 4 MG capsule Take 1 capsule (4 mg total) by mouth 3 (three) times daily. (Patient not taking: Reported on 03/12/2017) 30 capsule 0   No current facility-administered medications on file prior to visit.     Allergies  Allergen Reactions  . Hydrocodone Nausea And Vomiting  . Oxycodone Nausea And Vomiting    Family History  Problem Relation Age of Onset  . Migraines Mother        Living  . Healthy Father        Living  . Heart failure Maternal Grandmother   . Healthy Daughter        x1  . Allergies Son        x1  . Stomach cancer Unknown        Great uncle  .  Colon cancer Neg Hx     Social History   Social History  . Marital status: Married    Spouse name: N/A  . Number of children: 2  . Years of education: N/A   Social History Main Topics  . Smoking status: Former Smoker    Types: Cigarettes  . Smokeless tobacco: Never Used  . Alcohol use 0.0 oz/week     Comment: Very rare  . Drug use: Unknown  . Sexual activity: Not Asked   Other Topics Concern  . None   Social History Narrative   Pt lives with his wife. Lives in a three story homes, no issues with stairs. Some college   Review of Systems - See HPI.  All other ROS are negative.  BP 118/72   Pulse 69   Temp 98.8 F (37.1 C) (Oral)   Resp 14   Ht 5\' 10"  (1.778 m)   Wt 201 lb (91.2 kg)   SpO2 98%   BMI 28.84 kg/m   Physical Exam  Constitutional: He is oriented to person, place, and time and well-developed, well-nourished, and in no distress.  HENT:  Head: Normocephalic and atraumatic.  Right Ear: External ear normal.  Left Ear: External ear normal.  Nose: Nose normal.  Mouth/Throat: Oropharynx is clear  and moist.  Negative Dix Hallpike maneuver  Eyes: Conjunctivae are normal. Pupils are equal, round, and reactive to light.  Neck: Neck supple.  Cardiovascular: Normal rate, regular rhythm, normal heart sounds and intact distal pulses.   Pulmonary/Chest: Effort normal and breath sounds normal. No respiratory distress. He has no wheezes. He has no rales. He exhibits no tenderness.  Neurological: He is alert and oriented to person, place, and time.  Skin: Skin is warm and dry. No rash noted.  Vitals reviewed.  Assessment/Plan: 1. Worsening headaches Migraine headache with vestibular component that is new. STAT CT obtained and negative for acute abnormality. Start Imitrex and Meclizine. Supportive measures reviewed. Follow-up discussed. Strict ER precautions given to patient.  - CT Head Wo Contrast; Future   Piedad ClimesMartin, Devonia Farro Cody, PA-C

## 2017-03-12 NOTE — Progress Notes (Signed)
Pre visit review using our clinic review tool, if applicable. No additional management support is needed unless otherwise documented below in the visit note. 

## 2017-03-12 NOTE — Patient Instructions (Signed)
Your exam looks good today. I feel your symptoms are consistent with a vestibular migraine. Giving the severity of headache this morning with new intermittent vertigo, we are obtaining a head CT to make doubly sure there is nothing more worrisome occurring. Your neurological examination was good today in office which is why I am ordering the CT myself instead of sending you to the ER.  If normal we are planning on staring the medications we discussed. If anything abnormal we will send you straight to the ER.  If you note any new or worsening symptoms while waiting on CT, please go to the ER.  We are getting this CT scan scheduled ASAP

## 2017-03-19 DIAGNOSIS — Z630 Problems in relationship with spouse or partner: Secondary | ICD-10-CM | POA: Diagnosis not present

## 2017-03-19 DIAGNOSIS — F322 Major depressive disorder, single episode, severe without psychotic features: Secondary | ICD-10-CM | POA: Diagnosis not present

## 2017-03-26 DIAGNOSIS — Z630 Problems in relationship with spouse or partner: Secondary | ICD-10-CM | POA: Diagnosis not present

## 2017-03-26 DIAGNOSIS — Z569 Unspecified problems related to employment: Secondary | ICD-10-CM | POA: Diagnosis not present

## 2017-03-26 DIAGNOSIS — F322 Major depressive disorder, single episode, severe without psychotic features: Secondary | ICD-10-CM | POA: Diagnosis not present

## 2017-03-30 ENCOUNTER — Encounter: Payer: Self-pay | Admitting: Physician Assistant

## 2017-03-30 ENCOUNTER — Other Ambulatory Visit: Payer: Self-pay | Admitting: Physician Assistant

## 2017-04-03 ENCOUNTER — Encounter: Payer: Self-pay | Admitting: Physician Assistant

## 2017-04-03 DIAGNOSIS — K589 Irritable bowel syndrome without diarrhea: Secondary | ICD-10-CM | POA: Insufficient documentation

## 2017-05-01 DIAGNOSIS — F411 Generalized anxiety disorder: Secondary | ICD-10-CM | POA: Diagnosis not present

## 2017-05-07 DIAGNOSIS — F411 Generalized anxiety disorder: Secondary | ICD-10-CM | POA: Diagnosis not present

## 2017-05-07 DIAGNOSIS — F6381 Intermittent explosive disorder: Secondary | ICD-10-CM | POA: Diagnosis not present

## 2017-05-15 DIAGNOSIS — F6381 Intermittent explosive disorder: Secondary | ICD-10-CM | POA: Diagnosis not present

## 2017-05-15 DIAGNOSIS — F411 Generalized anxiety disorder: Secondary | ICD-10-CM | POA: Diagnosis not present

## 2017-05-22 DIAGNOSIS — F6381 Intermittent explosive disorder: Secondary | ICD-10-CM | POA: Diagnosis not present

## 2017-05-22 DIAGNOSIS — F411 Generalized anxiety disorder: Secondary | ICD-10-CM | POA: Diagnosis not present

## 2017-05-29 DIAGNOSIS — F411 Generalized anxiety disorder: Secondary | ICD-10-CM | POA: Diagnosis not present

## 2017-05-29 DIAGNOSIS — F6381 Intermittent explosive disorder: Secondary | ICD-10-CM | POA: Diagnosis not present

## 2017-06-05 DIAGNOSIS — F411 Generalized anxiety disorder: Secondary | ICD-10-CM | POA: Diagnosis not present

## 2017-06-05 DIAGNOSIS — F6381 Intermittent explosive disorder: Secondary | ICD-10-CM | POA: Diagnosis not present

## 2017-06-27 ENCOUNTER — Telehealth: Payer: Self-pay | Admitting: Emergency Medicine

## 2017-06-27 NOTE — Telephone Encounter (Signed)
Prior Authorization for Viberzi 75 mg approved from 05/27/17-09/24/2017 Patient pharmacy has been notified of PA approval.

## 2017-07-18 DIAGNOSIS — N46029 Azoospermia due to other extratesticular causes: Secondary | ICD-10-CM | POA: Diagnosis not present

## 2017-08-09 DIAGNOSIS — Z310 Encounter for reversal of previous sterilization: Secondary | ICD-10-CM | POA: Diagnosis not present

## 2017-08-09 DIAGNOSIS — N46029 Azoospermia due to other extratesticular causes: Secondary | ICD-10-CM | POA: Diagnosis not present

## 2017-08-11 ENCOUNTER — Other Ambulatory Visit: Payer: Self-pay | Admitting: Physician Assistant

## 2017-08-13 NOTE — Telephone Encounter (Signed)
Diazepam last refilled on 03/30/17 #30 Last OV 03/12/17 headache Please advise

## 2017-08-13 NOTE — Telephone Encounter (Signed)
[facility]

## 2017-10-02 DIAGNOSIS — Z630 Problems in relationship with spouse or partner: Secondary | ICD-10-CM | POA: Diagnosis not present

## 2017-10-02 DIAGNOSIS — Z653 Problems related to other legal circumstances: Secondary | ICD-10-CM | POA: Diagnosis not present

## 2017-10-02 DIAGNOSIS — F322 Major depressive disorder, single episode, severe without psychotic features: Secondary | ICD-10-CM | POA: Diagnosis not present

## 2017-10-19 ENCOUNTER — Ambulatory Visit (HOSPITAL_BASED_OUTPATIENT_CLINIC_OR_DEPARTMENT_OTHER)
Admission: RE | Admit: 2017-10-19 | Discharge: 2017-10-19 | Disposition: A | Discharge status: Home / Self Care | Payer: Federal, State, Local not specified - PPO | Source: Ambulatory Visit | Attending: Physician Assistant | Admitting: Physician Assistant

## 2017-10-19 ENCOUNTER — Encounter: Payer: Self-pay | Admitting: Physician Assistant

## 2017-10-19 ENCOUNTER — Ambulatory Visit: Payer: Federal, State, Local not specified - PPO | Admitting: Physician Assistant

## 2017-10-19 ENCOUNTER — Other Ambulatory Visit: Payer: Self-pay

## 2017-10-19 VITALS — BP 104/70 | HR 78 | Temp 98.1°F | Resp 14 | Ht 71.0 in | Wt 206.0 lb

## 2017-10-19 DIAGNOSIS — M79672 Pain in left foot: Secondary | ICD-10-CM

## 2017-10-19 DIAGNOSIS — R937 Abnormal findings on diagnostic imaging of other parts of musculoskeletal system: Secondary | ICD-10-CM | POA: Diagnosis not present

## 2017-10-19 DIAGNOSIS — M19072 Primary osteoarthritis, left ankle and foot: Secondary | ICD-10-CM | POA: Diagnosis not present

## 2017-10-19 NOTE — Patient Instructions (Signed)
Please go to [facility] for x-ray. I will call you with your results. Wear the ACE wrap to help with compression and pain.  Elevate leg while resting.   We will alter regimen according to results.

## 2017-10-19 NOTE — Progress Notes (Signed)
Patient presents to clinic today c/o 1 day of left anterior foot pain. Notes heavy lifting and pushing yesterday but denies trauma or injury. Noted mild soreness in dorsal forefoot yesterday evening. This morning notes significant pain with weightbearing and ambulation only. Denies redness or swelling. Denies numbness or tingling. Denies any decreased ROM. Denies history of stress fracture. Has not taken anything for symptoms.   Past Medical History:  Diagnosis Date  . Frequent headaches   . History of chicken pox   . Migraine   . Neuropathy    Right foot    Current Outpatient Medications on File Prior to Visit  Medication Sig Dispense Refill  . diazepam (VALIUM) 10 MG tablet TAKE 1/2-1 TABLET BY MOUTH EVERY NIGHT AT BEDTIME AS NEEDED FOR SLEEP 30 tablet 0  . ondansetron (ZOFRAN-ODT) 8 MG disintegrating tablet DISSOLVE 1 TABLET(8 MG) ON THE TONGUE EVERY 8 HOURS AS NEEDED FOR NAUSEA OR VOMITING 20 tablet 3  . meclizine (ANTIVERT) 25 MG tablet Take 1 tablet (25 mg total) by mouth 3 (three) times daily as needed for dizziness. (Patient not taking: Reported on 10/19/2017) 30 tablet 0  . VIBERZI 75 MG TABS TAKE 1 TABLET BY MOUTH TWICE DAILY (Patient not taking: Reported on 10/19/2017) 60 tablet 5   No current facility-administered medications on file prior to visit.     Allergies  Allergen Reactions  . Hydrocodone Nausea And Vomiting  . Oxycodone Nausea And Vomiting    Family History  Problem Relation Age of Onset  . Migraines Mother        Living  . Healthy Father        Living  . Heart failure Maternal Grandmother   . Healthy Daughter        x1  . Allergies Son        x1  . Stomach cancer Unknown        Great uncle  . Colon cancer Neg Hx     Social History   Socioeconomic History  . Marital status: Married    Spouse name: None  . Number of children: 2  . Years of education: None  . Highest education level: None  Social Needs  . Financial resource strain: None  . Food  insecurity - worry: None  . Food insecurity - inability: None  . Transportation needs - medical: None  . Transportation needs - non-medical: None  Occupational History  . None  Tobacco Use  . Smoking status: Former Smoker    Types: Cigarettes  . Smokeless tobacco: Never Used  Substance and Sexual Activity  . Alcohol use: Yes    Alcohol/week: 0.0 oz    Comment: Very rare  . Drug use: None  . Sexual activity: None  Other Topics Concern  . None  Social History Narrative   Pt lives with his wife. Lives in a three story homes, no issues with stairs. Some college    Review of Systems - See HPI.  All other ROS are negative.  BP 104/70   Pulse 78   Temp 98.1 F (36.7 C) (Oral)   Resp 14   Ht 5\' 11"  (1.803 m)   Wt 206 lb (93.4 kg)   SpO2 97%   BMI 28.73 kg/m   Physical Exam  Constitutional: He is well-developed, well-nourished, and in no distress.  HENT:  Head: Normocephalic and atraumatic.  Eyes: Conjunctivae are normal.  Cardiovascular: Normal rate, regular rhythm, normal heart sounds and intact distal pulses.  Pulses:  Dorsalis pedis pulses are 2+ on the left side.       Posterior tibial pulses are 2+ on the left side.  Pulmonary/Chest: Effort normal and breath sounds normal. No respiratory distress. He has no wheezes. He has no rales. He exhibits no tenderness.  Musculoskeletal:       Left ankle: Normal.       Left foot: Normal. There is normal range of motion, no tenderness, no swelling and no crepitus.  Pain in dorsal forefoot with weight bearing. Cannot reproduce with ROM or palpation.  Neurological: He is alert.  Skin: Skin is warm and dry. No rash noted.  Vitals reviewed.  Assessment/Plan: 1. Left foot pain Will obtain x-ray today to further assess per patient request. Unlikely there is a stress fracture or other joint insult in the absence of trauma. Discussed appropriate fitting footwear. ACE wrap to the area. Start RICE treatment. Patient refuses Rx for  pain. OTC medications reviewed. Will alter treatment according to results.  - DG Foot Complete Left; Future    Piedad Climes, PA-C

## 2017-11-20 DIAGNOSIS — Z3141 Encounter for fertility testing: Secondary | ICD-10-CM | POA: Diagnosis not present

## 2018-02-22 ENCOUNTER — Encounter: Payer: Self-pay | Admitting: Physician Assistant

## 2018-03-22 DIAGNOSIS — M21619 Bunion of unspecified foot: Secondary | ICD-10-CM | POA: Diagnosis not present

## 2018-03-22 DIAGNOSIS — M79672 Pain in left foot: Secondary | ICD-10-CM | POA: Diagnosis not present

## 2018-04-01 DIAGNOSIS — M79672 Pain in left foot: Secondary | ICD-10-CM | POA: Diagnosis not present

## 2018-04-05 DIAGNOSIS — M21612 Bunion of left foot: Secondary | ICD-10-CM | POA: Diagnosis not present

## 2018-04-05 DIAGNOSIS — M79672 Pain in left foot: Secondary | ICD-10-CM | POA: Diagnosis not present

## 2018-04-09 DIAGNOSIS — M25521 Pain in right elbow: Secondary | ICD-10-CM | POA: Diagnosis not present

## 2018-04-09 DIAGNOSIS — M25522 Pain in left elbow: Secondary | ICD-10-CM | POA: Diagnosis not present

## 2018-04-09 DIAGNOSIS — M25511 Pain in right shoulder: Secondary | ICD-10-CM | POA: Diagnosis not present

## 2018-04-09 DIAGNOSIS — M25512 Pain in left shoulder: Secondary | ICD-10-CM | POA: Diagnosis not present

## 2018-04-18 DIAGNOSIS — M25512 Pain in left shoulder: Secondary | ICD-10-CM | POA: Diagnosis not present

## 2018-04-24 DIAGNOSIS — M25512 Pain in left shoulder: Secondary | ICD-10-CM | POA: Diagnosis not present

## 2018-05-14 DIAGNOSIS — M25521 Pain in right elbow: Secondary | ICD-10-CM | POA: Diagnosis not present

## 2018-05-14 DIAGNOSIS — M25522 Pain in left elbow: Secondary | ICD-10-CM | POA: Diagnosis not present

## 2018-05-27 DIAGNOSIS — M25522 Pain in left elbow: Secondary | ICD-10-CM | POA: Diagnosis not present

## 2018-05-31 DIAGNOSIS — M7702 Medial epicondylitis, left elbow: Secondary | ICD-10-CM | POA: Diagnosis not present

## 2018-05-31 DIAGNOSIS — M25522 Pain in left elbow: Secondary | ICD-10-CM | POA: Diagnosis not present

## 2018-05-31 DIAGNOSIS — M25521 Pain in right elbow: Secondary | ICD-10-CM | POA: Diagnosis not present

## 2018-06-14 DIAGNOSIS — M7702 Medial epicondylitis, left elbow: Secondary | ICD-10-CM | POA: Diagnosis not present

## 2018-06-14 DIAGNOSIS — M7701 Medial epicondylitis, right elbow: Secondary | ICD-10-CM | POA: Diagnosis not present

## 2018-07-11 DIAGNOSIS — M7701 Medial epicondylitis, right elbow: Secondary | ICD-10-CM | POA: Diagnosis not present

## 2018-07-29 DIAGNOSIS — M7542 Impingement syndrome of left shoulder: Secondary | ICD-10-CM

## 2018-07-29 HISTORY — DX: Impingement syndrome of left shoulder: M75.42

## 2018-08-11 HISTORY — PX: SHOULDER ARTHROSCOPY: SHX128

## 2018-08-15 DIAGNOSIS — M7701 Medial epicondylitis, right elbow: Secondary | ICD-10-CM | POA: Diagnosis not present

## 2018-08-15 DIAGNOSIS — M7702 Medial epicondylitis, left elbow: Secondary | ICD-10-CM | POA: Diagnosis not present

## 2018-08-27 DIAGNOSIS — G8918 Other acute postprocedural pain: Secondary | ICD-10-CM | POA: Diagnosis not present

## 2018-08-27 DIAGNOSIS — M25512 Pain in left shoulder: Secondary | ICD-10-CM | POA: Diagnosis not present

## 2018-08-27 DIAGNOSIS — Z791 Long term (current) use of non-steroidal anti-inflammatories (NSAID): Secondary | ICD-10-CM | POA: Diagnosis not present

## 2018-08-27 DIAGNOSIS — Z4889 Encounter for other specified surgical aftercare: Secondary | ICD-10-CM | POA: Diagnosis not present

## 2018-08-27 DIAGNOSIS — M19012 Primary osteoarthritis, left shoulder: Secondary | ICD-10-CM | POA: Diagnosis not present

## 2018-08-27 DIAGNOSIS — M75112 Incomplete rotator cuff tear or rupture of left shoulder, not specified as traumatic: Secondary | ICD-10-CM | POA: Diagnosis not present

## 2018-08-27 DIAGNOSIS — M7542 Impingement syndrome of left shoulder: Secondary | ICD-10-CM | POA: Diagnosis not present

## 2018-08-27 DIAGNOSIS — M75122 Complete rotator cuff tear or rupture of left shoulder, not specified as traumatic: Secondary | ICD-10-CM | POA: Diagnosis not present

## 2018-08-27 DIAGNOSIS — R600 Localized edema: Secondary | ICD-10-CM | POA: Diagnosis not present

## 2018-09-06 DIAGNOSIS — M25512 Pain in left shoulder: Secondary | ICD-10-CM | POA: Diagnosis not present

## 2018-09-11 DIAGNOSIS — Z4889 Encounter for other specified surgical aftercare: Secondary | ICD-10-CM | POA: Diagnosis not present

## 2018-09-11 DIAGNOSIS — Z791 Long term (current) use of non-steroidal anti-inflammatories (NSAID): Secondary | ICD-10-CM | POA: Diagnosis not present

## 2018-09-11 DIAGNOSIS — R600 Localized edema: Secondary | ICD-10-CM | POA: Diagnosis not present

## 2018-09-11 DIAGNOSIS — M25512 Pain in left shoulder: Secondary | ICD-10-CM | POA: Diagnosis not present

## 2018-09-11 DIAGNOSIS — M7542 Impingement syndrome of left shoulder: Secondary | ICD-10-CM | POA: Diagnosis not present

## 2018-09-17 DIAGNOSIS — M7701 Medial epicondylitis, right elbow: Secondary | ICD-10-CM | POA: Diagnosis not present

## 2018-09-17 DIAGNOSIS — M7702 Medial epicondylitis, left elbow: Secondary | ICD-10-CM | POA: Diagnosis not present

## 2018-11-14 ENCOUNTER — Ambulatory Visit (INDEPENDENT_AMBULATORY_CARE_PROVIDER_SITE_OTHER): Payer: Federal, State, Local not specified - PPO | Admitting: Physician Assistant

## 2018-11-14 ENCOUNTER — Other Ambulatory Visit: Payer: Self-pay

## 2018-11-14 ENCOUNTER — Encounter: Payer: Self-pay | Admitting: Physician Assistant

## 2018-11-14 VITALS — BP 100/78 | HR 66 | Temp 98.1°F | Resp 14 | Ht 71.0 in | Wt 211.0 lb

## 2018-11-14 DIAGNOSIS — Z0000 Encounter for general adult medical examination without abnormal findings: Secondary | ICD-10-CM | POA: Diagnosis not present

## 2018-11-14 DIAGNOSIS — Z125 Encounter for screening for malignant neoplasm of prostate: Secondary | ICD-10-CM | POA: Diagnosis not present

## 2018-11-14 DIAGNOSIS — K580 Irritable bowel syndrome with diarrhea: Secondary | ICD-10-CM | POA: Diagnosis not present

## 2018-11-14 LAB — CBC WITH DIFFERENTIAL/PLATELET
BASOS PCT: 0.9 % (ref 0.0–3.0)
Basophils Absolute: 0.1 10*3/uL (ref 0.0–0.1)
EOS PCT: 3.7 % (ref 0.0–5.0)
Eosinophils Absolute: 0.2 10*3/uL (ref 0.0–0.7)
HEMATOCRIT: 42.1 % (ref 39.0–52.0)
Hemoglobin: 14.4 g/dL (ref 13.0–17.0)
Lymphocytes Relative: 17.1 % (ref 12.0–46.0)
Lymphs Abs: 1 10*3/uL (ref 0.7–4.0)
MCHC: 34.2 g/dL (ref 30.0–36.0)
MCV: 89.9 fl (ref 78.0–100.0)
MONOS PCT: 11.7 % (ref 3.0–12.0)
Monocytes Absolute: 0.7 10*3/uL (ref 0.1–1.0)
NEUTROS ABS: 3.8 10*3/uL (ref 1.4–7.7)
Neutrophils Relative %: 66.6 % (ref 43.0–77.0)
PLATELETS: 199 10*3/uL (ref 150.0–400.0)
RBC: 4.68 Mil/uL (ref 4.22–5.81)
RDW: 13 % (ref 11.5–15.5)
WBC: 5.7 10*3/uL (ref 4.0–10.5)

## 2018-11-14 LAB — LIPID PANEL
Cholesterol: 164 mg/dL (ref 0–200)
HDL: 38.3 mg/dL — AB (ref >39.00)
LDL Cholesterol: 94 mg/dL (ref 0–99)
NonHDL: 125.73
Total CHOL/HDL Ratio: 4
Triglycerides: 159 mg/dL — ABNORMAL HIGH (ref 0.0–149.0)
VLDL: 31.8 mg/dL (ref 0.0–40.0)

## 2018-11-14 LAB — COMPREHENSIVE METABOLIC PANEL
ALT: 21 U/L (ref 0–53)
AST: 20 U/L (ref 0–37)
Albumin: 4.4 g/dL (ref 3.5–5.2)
Alkaline Phosphatase: 74 U/L (ref 39–117)
BUN: 21 mg/dL (ref 6–23)
CO2: 30 meq/L (ref 19–32)
Calcium: 9.4 mg/dL (ref 8.4–10.5)
Chloride: 104 mEq/L (ref 96–112)
Creatinine, Ser: 1.17 mg/dL (ref 0.40–1.50)
GFR: 67.36 mL/min (ref >60.00)
Glucose, Bld: 70 mg/dL (ref 70–99)
Potassium: 4.2 mEq/L (ref 3.5–5.1)
Sodium: 140 mEq/L (ref 135–145)
Total Bilirubin: 0.6 mg/dL (ref 0.2–1.2)
Total Protein: 6.6 g/dL (ref 6.0–8.3)

## 2018-11-14 LAB — HEMOGLOBIN A1C: Hgb A1c MFr Bld: 5.4 % (ref 4.6–6.5)

## 2018-11-14 LAB — PSA: PSA: 0.17 ng/mL (ref 0.10–4.00)

## 2018-11-14 MED ORDER — RIFAXIMIN 550 MG PO TABS: 550.0000 mg | ORAL_TABLET | Freq: Three times a day (TID) | ORAL | 0 refills | Status: AC

## 2018-11-14 NOTE — Progress Notes (Signed)
Patient presents to clinic today for annual exam.  Patient is fasting for labs.  Acute Concerns: Denies acute concerns.   Chronic Issues: IBS-D -- Long-standing diagnosis. Previously on Viberzi which was extremely helpful initially but lost effectiveness. Also unable to afford even with insurance coverage and co-ay card. Has gone back to use of his Immodium which he has to take twice daily to get any relief. Denies any new or worsening symptoms. Would like to discuss other options.   Health Maintenance: Immunizations -- Declines Flu shot today. Tetanus up-to-date.  Past Medical History:  Diagnosis Date  . Frequent headaches   . History of chicken pox   . Migraine   . Neuropathy    Right foot    Past Surgical History:  Procedure Laterality Date  . pinched nerve foot     Left  . SHOULDER ARTHROSCOPY Left 08/2018  . WISDOM TOOTH EXTRACTION      Current Outpatient Medications on File Prior to Visit  Medication Sig Dispense Refill  . diazepam (VALIUM) 10 MG tablet TAKE 1/2-1 TABLET BY MOUTH EVERY NIGHT AT BEDTIME AS NEEDED FOR SLEEP 30 tablet 0  . VIBERZI 75 MG TABS TAKE 1 TABLET BY MOUTH TWICE DAILY (Patient not taking: Reported on 10/19/2017) 60 tablet 5   No current facility-administered medications on file prior to visit.     Allergies  Allergen Reactions  . Hydrocodone Nausea And Vomiting  . Oxycodone Nausea And Vomiting    Family History  Problem Relation Age of Onset  . Migraines Mother        Living  . Healthy Father        Living  . Heart failure Maternal Grandmother   . Healthy Daughter        x1  . Allergies Son        x1  . Stomach cancer Other        Great uncle  . Colon cancer Neg Hx     Social History   Socioeconomic History  . Marital status: Married    Spouse name: Not on file  . Number of children: 2  . Years of education: Not on file  . Highest education level: Not on file  Occupational History  . Not on file  Social Needs  .  Financial resource strain: Not on file  . Food insecurity:    Worry: Not on file    Inability: Not on file  . Transportation needs:    Medical: Not on file    Non-medical: Not on file  Tobacco Use  . Smoking status: Former Smoker    Types: Cigarettes  . Smokeless tobacco: Never Used  Substance and Sexual Activity  . Alcohol use: Yes    Alcohol/week: 0.0 standard drinks    Comment: Very rare  . Drug use: Not Currently  . Sexual activity: Yes  Lifestyle  . Physical activity:    Days per week: Not on file    Minutes per session: Not on file  . Stress: Not on file  Relationships  . Social connections:    Talks on phone: Not on file    Gets together: Not on file    Attends religious service: Not on file    Active member of club or organization: Not on file    Attends meetings of clubs or organizations: Not on file    Relationship status: Not on file  . Intimate partner violence:    Fear of current or ex partner:  Not on file    Emotionally abused: Not on file    Physically abused: Not on file    Forced sexual activity: Not on file  Other Topics Concern  . Not on file  Social History Narrative   Pt lives with his wife. Lives in a three story homes, no issues with stairs. Some college   Review of Systems  Constitutional: Negative for fever and weight loss.  HENT: Negative for ear discharge, ear pain, hearing loss and tinnitus.   Eyes: Negative for blurred vision, double vision, photophobia and pain.  Respiratory: Negative for cough and shortness of breath.   Cardiovascular: Negative for chest pain and palpitations.  Gastrointestinal: Negative for abdominal pain, blood in stool, constipation, diarrhea, heartburn, melena, nausea and vomiting.  Genitourinary: Negative for dysuria, flank pain, frequency, hematuria and urgency.  Musculoskeletal: Negative for falls.  Neurological: Negative for dizziness, loss of consciousness and headaches.  Endo/Heme/Allergies: Negative for  environmental allergies.  Psychiatric/Behavioral: Negative for depression, hallucinations, substance abuse and suicidal ideas. The patient is not nervous/anxious and does not have insomnia.    BP 100/78   Pulse 66   Temp 98.1 F (36.7 C) (Oral)   Resp 14   Ht 5\' 11"  (1.803 m)   Wt 211 lb (95.7 kg)   SpO2 98%   BMI 29.43 kg/m   Physical Exam Vitals signs reviewed.  Constitutional:      General: He is not in acute distress.    Appearance: He is well-developed. He is not diaphoretic.  HENT:     Head: Normocephalic and atraumatic.     Right Ear: Tympanic membrane, ear canal and external ear normal.     Left Ear: Tympanic membrane, ear canal and external ear normal.     Nose: Nose normal.     Mouth/Throat:     Pharynx: No posterior oropharyngeal erythema.  Eyes:     Conjunctiva/sclera: Conjunctivae normal.     Pupils: Pupils are equal, round, and reactive to light.  Neck:     Musculoskeletal: Neck supple.     Thyroid: No thyromegaly.  Cardiovascular:     Rate and Rhythm: Normal rate and regular rhythm.     Heart sounds: Normal heart sounds.  Pulmonary:     Effort: Pulmonary effort is normal. No respiratory distress.     Breath sounds: Normal breath sounds. No wheezing or rales.  Chest:     Chest wall: No tenderness.  Abdominal:     General: Bowel sounds are normal. There is no distension.     Palpations: Abdomen is soft. There is no mass.     Tenderness: There is no abdominal tenderness. There is no guarding or rebound.  Lymphadenopathy:     Cervical: No cervical adenopathy.  Skin:    General: Skin is warm and dry.     Findings: No rash.  Neurological:     Mental Status: He is alert and oriented to person, place, and time.     Cranial Nerves: No cranial nerve deficit.    Assessment/Plan: 1. Visit for preventive health examination Depression screen negative. Health Maintenance reviewed -- Declines flu shot. Preventive schedule discussed and handout given in  AVS. Will obtain fasting labs today.  - CBC with Differential/Platelet - Comprehensive metabolic panel - Lipid panel - Hemoglobin A1c  2. Irritable bowel syndrome with diarrhea Start trial of Xifaxan. Co-pay card given. Follow-up discussed. - rifaximin (XIFAXAN) 550 MG TABS tablet; Take 1 tablet (550 mg total) by mouth 3 (three) times  daily for 14 days.  Dispense: 42 tablet; Refill: 0  3. Prostate cancer screening The natural history of prostate cancer and ongoing controversy regarding screening and potential treatment outcomes of prostate cancer has been discussed with the patient. The meaning of a false positive PSA and a false negative PSA has been discussed. He indicates understanding of the limitations of this screening test and wishes to proceed with screening PSA testing.  - PSA   Piedad Climes, PA-C

## 2018-11-14 NOTE — Patient Instructions (Signed)
Please go to the lab for blood work.   Our office will call you with your results unless you have chosen to receive results via MyChart.  If your blood work is normal we will follow-up each year for physicals and as scheduled for chronic medical problems.  If anything is abnormal we will treat accordingly and get you in for a follow-up.  Please use the handout given to download a coupon for the Xifaxan.  This will hopefully make it free of charge. If you have any issue let me know.    Preventive Care 40-64 Years, Male Preventive care refers to lifestyle choices and visits with your health care provider that can promote health and wellness. What does preventive care include?   A yearly physical exam. This is also called an annual well check.  Dental exams once or twice a year.  Routine eye exams. Ask your health care provider how often you should have your eyes checked.  Personal lifestyle choices, including: ? Daily care of your teeth and gums. ? Regular physical activity. ? Eating a healthy diet. ? Avoiding tobacco and drug use. ? Limiting alcohol use. ? Practicing safe sex. ? Taking low-dose aspirin every day starting at age 100. What happens during an annual well check? The services and screenings done by your health care provider during your annual well check will depend on your age, overall health, lifestyle risk factors, and family history of disease. Counseling Your health care provider may ask you questions about your:  Alcohol use.  Tobacco use.  Drug use.  Emotional well-being.  Home and relationship well-being.  Sexual activity.  Eating habits.  Work and work Statistician. Screening You may have the following tests or measurements:  Height, weight, and BMI.  Blood pressure.  Lipid and cholesterol levels. These may be checked every 5 years, or more frequently if you are over 82 years old.  Skin check.  Lung cancer screening. You may have this  screening every year starting at age 27 if you have a 30-pack-year history of smoking and currently smoke or have quit within the past 15 years.  Colorectal cancer screening. All adults should have this screening starting at age 62 and continuing until age 36. Your health care provider may recommend screening at age 48. You will have tests every 1-10 years, depending on your results and the type of screening test. People at increased risk should start screening at an earlier age. Screening tests may include: ? Guaiac-based fecal occult blood testing. ? Fecal immunochemical test (FIT). ? Stool DNA test. ? Virtual colonoscopy. ? Sigmoidoscopy. During this test, a flexible tube with a tiny camera (sigmoidoscope) is used to examine your rectum and lower colon. The sigmoidoscope is inserted through your anus into your rectum and lower colon. ? Colonoscopy. During this test, a long, thin, flexible tube with a tiny camera (colonoscope) is used to examine your entire colon and rectum.  Prostate cancer screening. Recommendations will vary depending on your family history and other risks.  Hepatitis C blood test.  Hepatitis B blood test.  Sexually transmitted disease (STD) testing.  Diabetes screening. This is done by checking your blood sugar (glucose) after you have not eaten for a while (fasting). You may have this done every 1-3 years. Discuss your test results, treatment options, and if necessary, the need for more tests with your health care provider. Vaccines Your health care provider may recommend certain vaccines, such as:  Influenza vaccine. This is recommended every year.  Tetanus, diphtheria, and acellular pertussis (Tdap, Td) vaccine. You may need a Td booster every 10 years.  Varicella vaccine. You may need this if you have not been vaccinated.  Zoster vaccine. You may need this after age 14.  Measles, mumps, and rubella (MMR) vaccine. You may need at least one dose of MMR if you  were born in 1957 or later. You may also need a second dose.  Pneumococcal 13-valent conjugate (PCV13) vaccine. You may need this if you have certain conditions and have not been vaccinated.  Pneumococcal polysaccharide (PPSV23) vaccine. You may need one or two doses if you smoke cigarettes or if you have certain conditions.  Meningococcal vaccine. You may need this if you have certain conditions.  Hepatitis A vaccine. You may need this if you have certain conditions or if you travel or work in places where you may be exposed to hepatitis A.  Hepatitis B vaccine. You may need this if you have certain conditions or if you travel or work in places where you may be exposed to hepatitis B.  Haemophilus influenzae type b (Hib) vaccine. You may need this if you have certain risk factors. Talk to your health care provider about which screenings and vaccines you need and how often you need them. This information is not intended to replace advice given to you by your health care provider. Make sure you discuss any questions you have with your health care provider. Document Released: 09/24/2015 Document Revised: 10/18/2017 Document Reviewed: 06/29/2015 Elsevier Interactive Patient Education  [employer]. .

## 2018-11-17 ENCOUNTER — Encounter: Payer: Self-pay | Admitting: Physician Assistant

## 2018-11-21 DIAGNOSIS — M7702 Medial epicondylitis, left elbow: Secondary | ICD-10-CM | POA: Diagnosis not present

## 2018-12-12 ENCOUNTER — Encounter: Payer: Self-pay | Admitting: Physician Assistant

## 2019-01-23 DIAGNOSIS — M7702 Medial epicondylitis, left elbow: Secondary | ICD-10-CM | POA: Diagnosis not present

## 2019-02-05 DIAGNOSIS — M7702 Medial epicondylitis, left elbow: Secondary | ICD-10-CM | POA: Diagnosis not present

## 2019-02-05 DIAGNOSIS — S4992XA Unspecified injury of left shoulder and upper arm, initial encounter: Secondary | ICD-10-CM | POA: Diagnosis not present

## 2019-02-18 DIAGNOSIS — M25629 Stiffness of unspecified elbow, not elsewhere classified: Secondary | ICD-10-CM | POA: Diagnosis not present

## 2019-02-27 DIAGNOSIS — M25629 Stiffness of unspecified elbow, not elsewhere classified: Secondary | ICD-10-CM | POA: Diagnosis not present

## 2019-03-06 DIAGNOSIS — M25522 Pain in left elbow: Secondary | ICD-10-CM | POA: Diagnosis not present

## 2019-03-10 DIAGNOSIS — Z3141 Encounter for fertility testing: Secondary | ICD-10-CM | POA: Diagnosis not present

## 2019-03-13 DIAGNOSIS — M25522 Pain in left elbow: Secondary | ICD-10-CM | POA: Diagnosis not present

## 2019-03-14 IMAGING — DX DG FOOT COMPLETE 3+V*L*
3 series · 3 of 3 positions shown · non-contrast
Comparison: No fracture or malalignment identified. Small
degenerative subcortical cystic lesion in the head of the non

CLINICAL DATA: Pain in foot after yard work yesterday.

EXAM:
LEFT FOOT - COMPLETE 3+ VIEW

[foot ap]
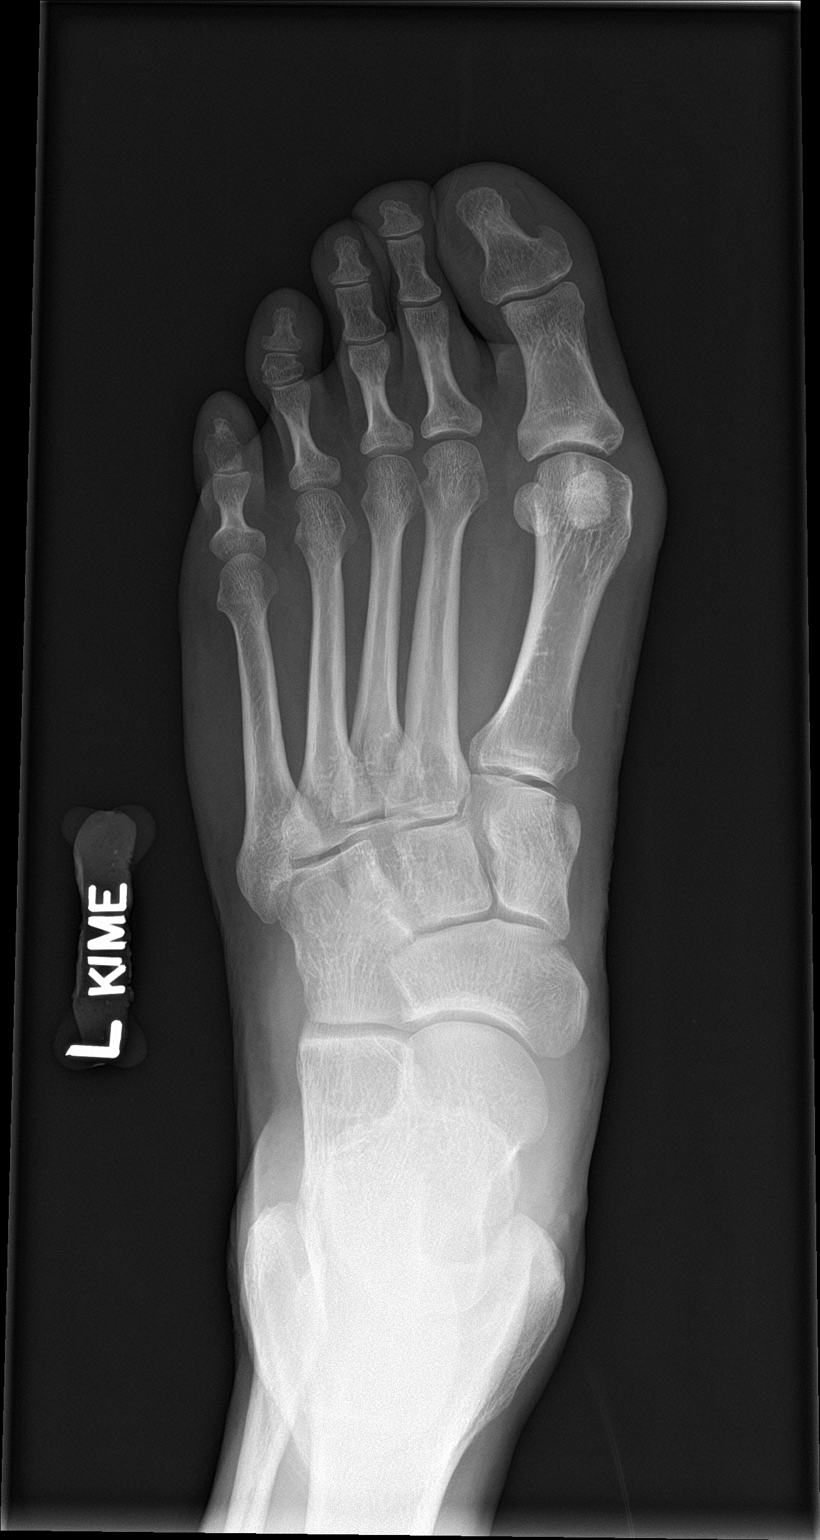

[foot obl]
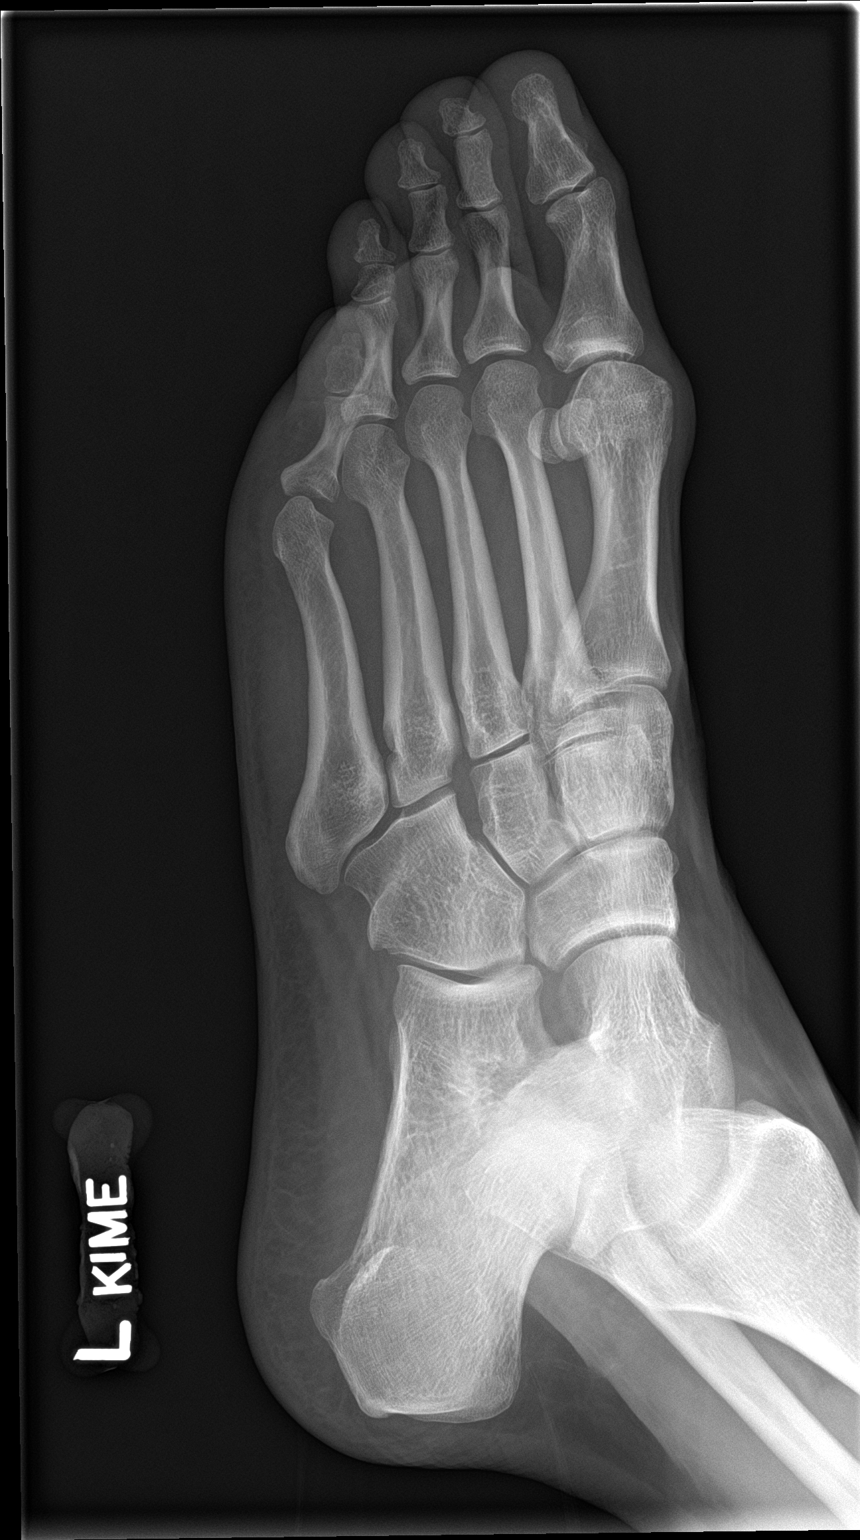

[foot lat]
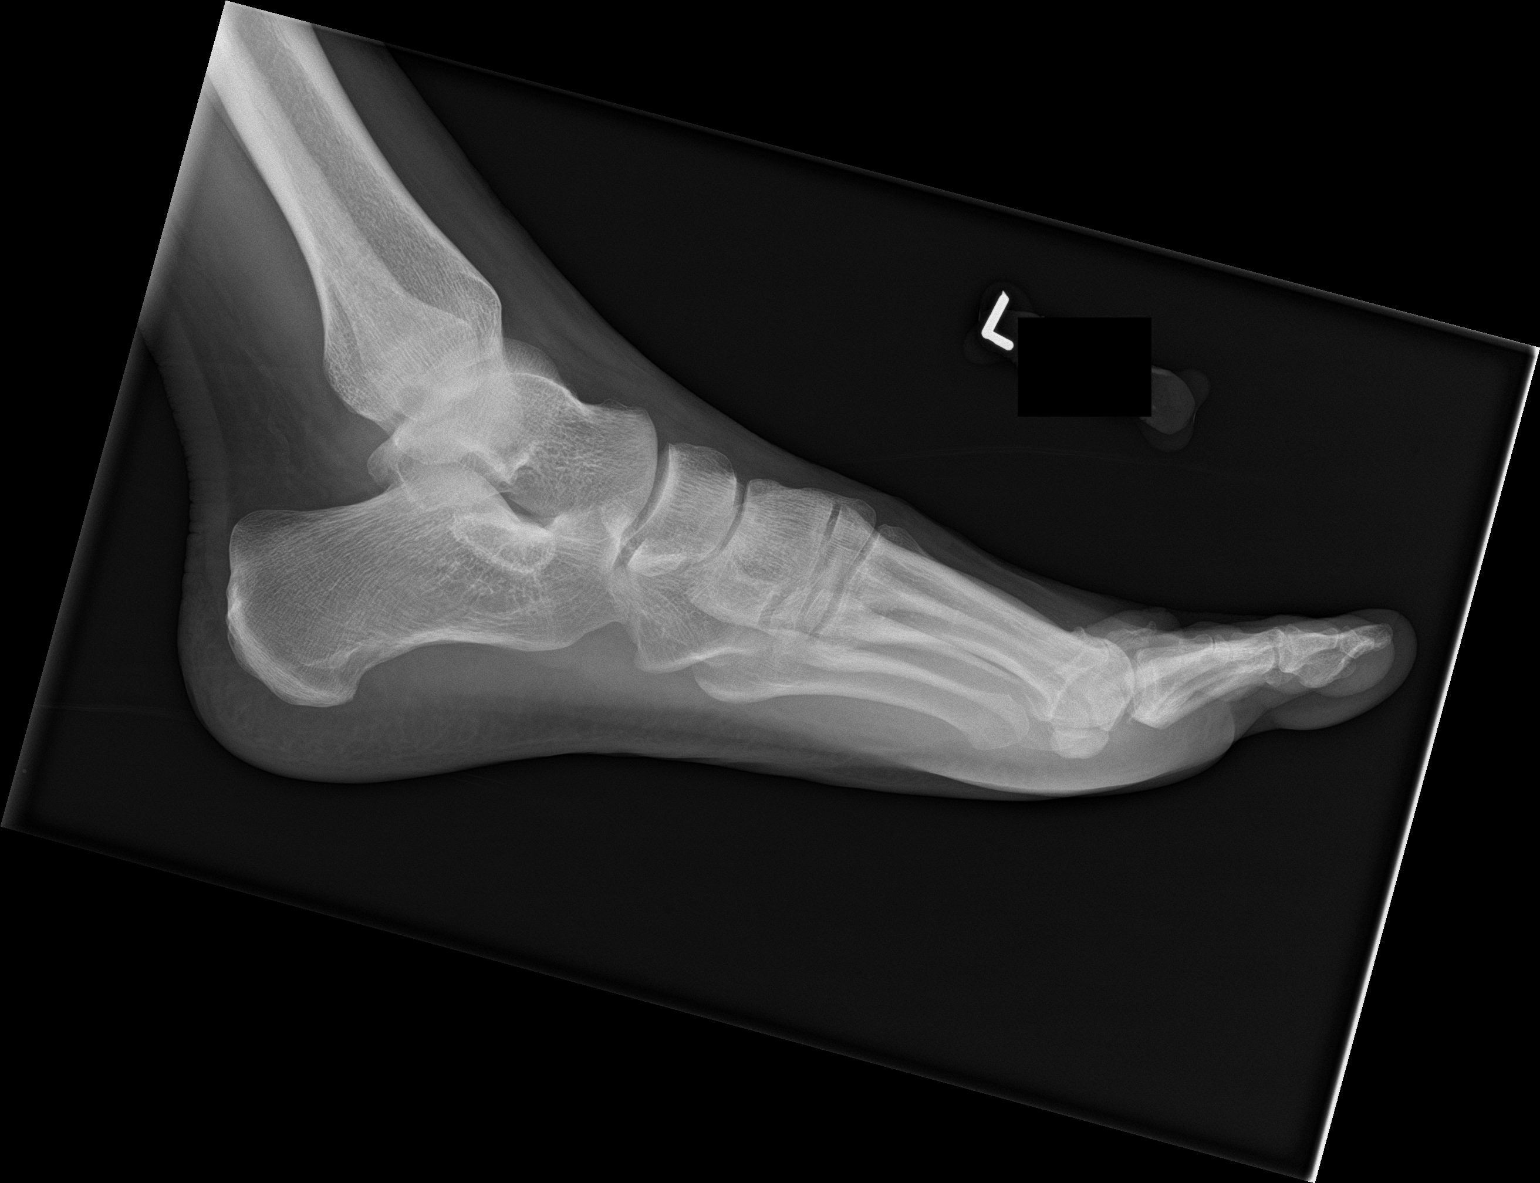

[3 of 3 positions shown; findings below may reference images not displayed]

FINDINGS: Small degenerative subcortical cystic lesion in the head of the
first metatarsal.

No acute bony findings.  No dislocation.
IMPRESSION: 1. No acute bony findings.
2. Likely degenerative subcortical cystic lesion in the head of the
first metatarsal.

## 2019-04-02 DIAGNOSIS — M25522 Pain in left elbow: Secondary | ICD-10-CM | POA: Diagnosis not present

## 2019-04-23 DIAGNOSIS — M25522 Pain in left elbow: Secondary | ICD-10-CM | POA: Diagnosis not present

## 2019-06-04 ENCOUNTER — Telehealth: Payer: Federal, State, Local not specified - PPO | Admitting: Nurse Practitioner

## 2019-06-04 DIAGNOSIS — J320 Chronic maxillary sinusitis: Secondary | ICD-10-CM | POA: Diagnosis not present

## 2019-06-04 MED ORDER — FLUTICASONE PROPIONATE 50 MCG/ACT NA SUSP: 2.0000 | Freq: Every day | NASAL | 6 refills | Status: DC

## 2019-06-04 NOTE — Progress Notes (Signed)
We are sorry that you are not feeling well.  Here is how we plan to help!  Based on what you have shared with me it looks like you have sinusitis.  Sinusitis is inflammation and infection in the sinus cavities of the head.  Based on your presentation I believe you most likely have Acute Viral Sinusitis.This is an infection most likely caused by a virus. There is not specific treatment for viral sinusitis other than to help you with the symptoms until the infection runs its course.  You may use an oral decongestant such as Mucinex D or if you have glaucoma or high blood pressure use plain Mucinex. Saline nasal spray help and can safely be used as often as needed for congestion, I have prescribed: Fluticasone nasal spray two sprays in each nostril once a day   *benzedrex is a lot like afrin naslals spray and is very addicting. If you use more then 3 days you start to get a rebound affect from medication. Meaning that it will open you up for a few mintes then you get rebound effect taht shuts down nasal passage and you will feel more stopped up making you think you need to use it again. You have to STOP this benzedrex now. I have sent in rx for genericv flonasehat you can use 2x a day. It will take several days for you to start feeling better.  Some authorities believe that zinc sprays or the use of Echinacea may shorten the course of your symptoms.  Sinus infections are not as easily transmitted as other respiratory infection, however we still recommend that you avoid close contact with loved ones, especially the very young and elderly.  Remember to wash your hands thoroughly throughout the day as this is the number one way to prevent the spread of infection!  Home Care:  Only take medications as instructed by your medical team.  Do not take these medications with alcohol.  A steam or ultrasonic humidifier can help congestion.  You can place a towel over your head and breathe in the steam from hot  water coming from a faucet.  Avoid close contacts especially the very young and the elderly.  Cover your mouth when you cough or sneeze.  Always remember to wash your hands.  Get Help Right Away If:  You develop worsening fever or sinus pain.  You develop a severe head ache or visual changes.  Your symptoms persist after you have completed your treatment plan.  Make sure you  Understand these instructions.  Will watch your condition.  Will get help right away if you are not doing well or get worse.  Your e-visit answers were reviewed by a board certified advanced clinical practitioner to complete your personal care plan.  Depending on the condition, your plan could have included both over the counter or prescription medications.  If there is a problem please reply  once you have received a response from your provider.  Your safety is important to [place].  If you have drug allergies check your prescription carefully.    You can use MyChart to ask questions about today's visit, request a non-urgent call back, or ask for a work or school excuse for 24 hours related to this e-Visit. If it has been greater than 24 hours you will need to follow up with your provider, or enter a new e-Visit to address those concerns.  You will get an e-mail in the next two days asking about your experience.  I hope that your e-visit has been valuable and will speed your recovery. Thank you for using e-visits.   5-10 minutes spent reviewing and documenting in chart.

## 2019-07-08 ENCOUNTER — Other Ambulatory Visit: Payer: Self-pay

## 2019-07-08 DIAGNOSIS — Z20828 Contact with and (suspected) exposure to other viral communicable diseases: Secondary | ICD-10-CM | POA: Diagnosis not present

## 2019-07-08 DIAGNOSIS — Z20822 Contact with and (suspected) exposure to covid-19: Secondary | ICD-10-CM

## 2019-07-10 LAB — NOVEL CORONAVIRUS, NAA: SARS-CoV-2, NAA: NOT DETECTED

## 2019-07-21 ENCOUNTER — Other Ambulatory Visit: Payer: Self-pay

## 2019-07-21 DIAGNOSIS — Z20822 Contact with and (suspected) exposure to covid-19: Secondary | ICD-10-CM

## 2019-07-22 LAB — NOVEL CORONAVIRUS, NAA: SARS-CoV-2, NAA: NOT DETECTED

## 2019-07-31 ENCOUNTER — Encounter: Payer: Self-pay | Admitting: Physician Assistant

## 2019-07-31 DIAGNOSIS — R109 Unspecified abdominal pain: Secondary | ICD-10-CM

## 2019-07-31 DIAGNOSIS — G8929 Other chronic pain: Secondary | ICD-10-CM

## 2019-08-01 ENCOUNTER — Encounter: Payer: Self-pay | Admitting: Nurse Practitioner

## 2019-08-02 ENCOUNTER — Encounter: Payer: Self-pay | Admitting: Physician Assistant

## 2019-08-16 NOTE — Progress Notes (Signed)
08/16/2019 Scott AdolphRichard Saetern [id number] December 15, 1970   HISTORY OF PRESENT ILLNESS: Scott Cummings is a 48 year old male with a past medical history of headaches, right foot nephropathy and diarrhea predominant IBS.  He presents today for further evaluation regarding epigastric pain.  He reports having intermittent epigastric pain for the past few years, however, over the past few months his pain is more frequent and persistent.  His upper abdominal pain is worse if he does not eat every 2-3 hours.  He denies having any heartburn or dysphagia.  No nausea or vomiting.  He previously drank 2 cups of coffee daily, he stopped drinking coffee for 3 months without improvement.  He is back to drinking at least 1 cup of coffee daily.  He takes Aleve 220 mg 1 to 2 tablets daily for chronic left foot pain.  No history of ulcers.  Never had an EGD.  He reports his IBS D is well controlled as long as he takes Loperamide 2 mg 2 tablets in the morning.  He infrequently takes a third Loperamide tablet later in the day.  If he takes 4 Loperamide tablets in 1 day he is constipated for the next 24 hours.  He is currently passing a normal mostly formed stool once daily.  No rectal bleeding or melena.  He previously used NepalXifaxan and Viberzi for IBS-D without improvement.  Celiac serology in 2016 was negative.  He underwent a colonoscopy 07/09/2015 which was normal, no evidence of inflammatory bowel disease or microscopic colitis.  His weight is stable.  His great uncle had stomach cancer.   Colonoscopy 07/09/2015 by Dr. Christella HartiganJacobs: 1. The examined terminal ileum appeared to be normal 2. Normal colonoscopy; multiple random biopsies were performed using cold forceps Surgical [P], random sites - PROMINENT BENIGN LYMPHOID AGGREGATE; OTHERWISE HISTOLOGICALLY NORMAL COLONIC MUCOSA - NEGATIVE FOR MORPHOLOGIC FEATURES OF IDIOPATHIC INFLAMMATORY BOWEL DISEASE, MICROSCOPIC COLITIS OR INFECTIOUS COLITIS - NEGATIVE FOR DYSPLASIA   Past  Medical History:  Diagnosis Date  . Frequent headaches   . History of chicken pox   . Migraine   . Neuropathy    Right foot   Past Surgical History:  Procedure Laterality Date  . pinched nerve foot     Left  . SHOULDER ARTHROSCOPY Left 08/2018  . WISDOM TOOTH EXTRACTION      reports that he has quit smoking. His smoking use included cigarettes. He has never used smokeless tobacco. He reports current alcohol use. He reports previous drug use. family history includes Allergies in his son; Healthy in his daughter and father; Heart failure in his maternal grandmother; Migraines in his mother; Stomach cancer in an other family member. Allergies  Allergen Reactions  . Hydrocodone Nausea And Vomiting  . Oxycodone Nausea And Vomiting   Current Outpatient Medications on File Prior to Visit  Medication Sig Dispense Refill  . loperamide (IMODIUM) 2 MG capsule Take by mouth daily.    . diazepam (VALIUM) 10 MG tablet TAKE 1/2-1 TABLET BY MOUTH EVERY NIGHT AT BEDTIME AS NEEDED FOR SLEEP 30 tablet 0  . fluticasone (FLONASE) 50 MCG/ACT nasal spray Place 2 sprays into both nostrils daily. 16 g 6   No current facility-administered medications on file prior to visit.    Family History  Problem Relation Age of Onset  . Migraines Mother        Living  . Healthy Father        Living  . Heart failure Maternal Grandmother   . Healthy  Daughter        x1  . Allergies Son        x1  . Stomach cancer Other        Great uncle  . Colon cancer Neg Hx    Social History   Socioeconomic History  . Marital status: Married    Spouse name: Not on file  . Number of children: 2  . Years of education: Not on file  . Highest education level: Not on file  Occupational History  . Not on file  Social Needs  . Financial resource strain: Not on file  . Food insecurity    Worry: Not on file    Inability: Not on file  . Transportation needs    Medical: Not on file    Non-medical: Not on file  Tobacco  Use  . Smoking status: Former Smoker    Types: Cigarettes  . Smokeless tobacco: Never Used  Substance and Sexual Activity  . Alcohol use: Yes    Alcohol/week: 0.0 standard drinks    Comment: Very rare  . Drug use: Not Currently  . Sexual activity: Yes  Lifestyle  . Physical activity    Days per week: Not on file    Minutes per session: Not on file  . Stress: Not on file  Relationships  . Social Musician on phone: Not on file    Gets together: Not on file    Attends religious service: Not on file    Active member of club or organization: Not on file    Attends meetings of clubs or organizations: Not on file    Relationship status: Not on file  . Intimate partner violence    Fear of current or ex partner: Not on file    Emotionally abused: Not on file    Physically abused: Not on file    Forced sexual activity: Not on file  Other Topics Concern  . Not on file  Social History Narrative   Pt lives with his wife. Lives in a three story homes, no issues with stairs. Some college     REVIEW OF SYSTEMS  : All other systems reviewed and negative except where noted in the History of Present Illness.  PHYSICAL EXAM: BP 122/78   Pulse 73   Temp 97.6 F (36.4 C)   Ht 5\' 11"  (1.803 m)   Wt 210 lb (95.3 kg)   BMI 29.29 kg/m   General: Well developed 48 year old male in no acute distress Head: Normocephalic and atraumatic Eyes:  Sclerae anicteric,conjunctive pink. Ears: Normal auditory acuity Neck: Supple, no masses.  Lungs: Clear throughout to auscultation Heart: Regular rate and rhythm Abdomen: Soft, epigastric tenderness without rebound or guarding,  non distended. No masses or hepatomegaly noted. Normal bowel sounds Rectal: Deferred. Musculoskeletal: Symmetrical with no gross deformities  Skin: No lesions on visible extremities Extremities: No edema  Neurological: Alert oriented x 4, grossly nonfocal Cervical Nodes:  No significant cervical adenopathy  Psychological:  Alert and cooperative. Normal mood and affect  ASSESSMENT AND PLAN:  29. 48 year old male with epigastric pain in setting of NSAID use -EGD benefits and risk discussed including risk with sedation, risk of bleeding, perforation and infection -Famotidine 20 mg 1 tab p.o. daily -CBC, CMP, CRP and lipase -Abdominal sonogram -Reduce NSAID use if tolerated -Further follow-up to be determined after EGD completed  2. IBS-D -Continue loperamide 2 mg 2 tabs p.o. every morning. -Patient will call  office if his IBS-D is not well controlled -Recall colonoscopy 10/ 2025 previously entered   CC:  Brunetta Jeans, PA-C

## 2019-08-18 ENCOUNTER — Other Ambulatory Visit (INDEPENDENT_AMBULATORY_CARE_PROVIDER_SITE_OTHER): Payer: Federal, State, Local not specified - PPO

## 2019-08-18 ENCOUNTER — Other Ambulatory Visit: Payer: Self-pay

## 2019-08-18 ENCOUNTER — Ambulatory Visit: Payer: Federal, State, Local not specified - PPO | Admitting: Nurse Practitioner

## 2019-08-18 ENCOUNTER — Encounter: Payer: Self-pay | Admitting: Nurse Practitioner

## 2019-08-18 ENCOUNTER — Encounter: Payer: Self-pay | Admitting: General Surgery

## 2019-08-18 VITALS — BP 122/78 | HR 73 | Temp 97.6°F | Ht 71.0 in | Wt 210.0 lb

## 2019-08-18 DIAGNOSIS — R1013 Epigastric pain: Secondary | ICD-10-CM

## 2019-08-18 DIAGNOSIS — K580 Irritable bowel syndrome with diarrhea: Secondary | ICD-10-CM

## 2019-08-18 LAB — CBC WITH DIFFERENTIAL/PLATELET
Basophils Absolute: 0.1 10*3/uL (ref 0.0–0.1)
Basophils Relative: 0.8 % (ref 0.0–3.0)
Eosinophils Absolute: 0.2 10*3/uL (ref 0.0–0.7)
Eosinophils Relative: 2 % (ref 0.0–5.0)
HCT: 46.5 % (ref 39.0–52.0)
Hemoglobin: 15.8 g/dL (ref 13.0–17.0)
Lymphocytes Relative: 16.9 % (ref 12.0–46.0)
Lymphs Abs: 1.3 10*3/uL (ref 0.7–4.0)
MCHC: 34 g/dL (ref 30.0–36.0)
MCV: 89.3 fl (ref 78.0–100.0)
Monocytes Absolute: 0.6 10*3/uL (ref 0.1–1.0)
Monocytes Relative: 8.2 % (ref 3.0–12.0)
Neutro Abs: 5.6 10*3/uL (ref 1.4–7.7)
Neutrophils Relative %: 72.1 % (ref 43.0–77.0)
Platelets: 239 10*3/uL (ref 150.0–400.0)
RBC: 5.2 Mil/uL (ref 4.22–5.81)
RDW: 13.2 % (ref 11.5–15.5)
WBC: 7.7 10*3/uL (ref 4.0–10.5)

## 2019-08-18 LAB — LIPASE: Lipase: 21 U/L (ref 11.0–59.0)

## 2019-08-18 LAB — C-REACTIVE PROTEIN: CRP: <1 mg/dL (ref 0.5–20.0)

## 2019-08-18 MED ORDER — FAMOTIDINE 20 MG PO TABS: 20.0000 mg | ORAL_TABLET | Freq: Every day | ORAL | 1 refills | Status: DC

## 2019-08-18 NOTE — Patient Instructions (Addendum)
If you are age 46 or older, your body mass index should be between 23-30. Your Body mass index is 29.29 kg/m. If this is out of the aforementioned range listed, please consider follow up with your Primary Care Provider.  If you are age 35 or younger, your body mass index should be between 19-25. Your Body mass index is 29.29 kg/m. If this is out of the aformentioned range listed, please consider follow up with your Primary Care Provider.   We have sent the following medications to your pharmacy for you to pick up at your convenience:  Famotadine 20 mg 1 daily   Your provider has requested that you go to the basement level for lab work before leaving today. Press "B" on the elevator. The lab is located at the first door on the left as you exit the elevator.  You have been scheduled for an abdominal ultrasound at [facility] (1st floor of hospital) on 08/25/2019 at 10:00. Please arrive 15 minutes prior to your appointment for registration. Make certain not to have anything to eat or drink 6 hours prior to your appointment. Should you need to reschedule your appointment, please contact radiology at [phone]. This test typically takes about 30 minutes to perform.  You have been scheduled for an endoscopy. Please follow written instructions given to you at your visit today. If you use inhalers (even only as needed), please bring them with you on the day of your procedure.  Thank you for choosing me and [facility]

## 2019-08-18 NOTE — Progress Notes (Signed)
I agree with the above note, plan 

## 2019-08-25 ENCOUNTER — Ambulatory Visit (HOSPITAL_COMMUNITY): Payer: Federal, State, Local not specified - PPO

## 2019-08-26 ENCOUNTER — Encounter: Payer: Federal, State, Local not specified - PPO | Admitting: Gastroenterology

## 2019-08-27 ENCOUNTER — Encounter: Payer: Self-pay | Admitting: Physician Assistant

## 2019-08-27 DIAGNOSIS — J329 Chronic sinusitis, unspecified: Secondary | ICD-10-CM

## 2019-09-15 ENCOUNTER — Other Ambulatory Visit: Payer: Self-pay

## 2019-09-15 ENCOUNTER — Encounter (INDEPENDENT_AMBULATORY_CARE_PROVIDER_SITE_OTHER): Payer: Self-pay | Admitting: Otolaryngology

## 2019-09-15 ENCOUNTER — Ambulatory Visit (INDEPENDENT_AMBULATORY_CARE_PROVIDER_SITE_OTHER): Payer: Federal, State, Local not specified - PPO | Admitting: Otolaryngology

## 2019-09-15 VITALS — Temp 98.4°F

## 2019-09-15 DIAGNOSIS — J342 Deviated nasal septum: Secondary | ICD-10-CM | POA: Diagnosis not present

## 2019-09-15 DIAGNOSIS — J310 Chronic rhinitis: Secondary | ICD-10-CM

## 2019-09-15 NOTE — Progress Notes (Addendum)
HPI: Scott Cummings is a 49 y.o. male who presents is referred by PCP for evaluation of chronic sinus problems.  He apparently has had chronic sinus issues for a number of years but they have been worse over the past several months.  He apparently is using Zycam nasal spray in order to breathe adequately.  Zicam is a decongestant spray with oxymetazoline.  He has tried Flonase in the past without much success.  He is always had problems with trouble breathing through his nose.  He also complains of chronic postnasal drainage.  Denies any yellow-green discharge or fevers although he does describe some pressure around and behind his eyes at times.  He has recently been treated with antibiotics for a sinus infection.  Past Medical History:  Diagnosis Date  . Frequent headaches   . History of chicken pox   . Migraine   . Neuropathy    Right foot   Past Surgical History:  Procedure Laterality Date  . pinched nerve foot     Left  . SHOULDER ARTHROSCOPY Left 08/2018  . WISDOM TOOTH EXTRACTION     Social History   Socioeconomic History  . Marital status: Married    Spouse name: Not on file  . Number of children: 2  . Years of education: Not on file  . Highest education level: Not on file  Occupational History  . Not on file  Tobacco Use  . Smoking status: Former Smoker    Types: Cigarettes    Quit date: 1996    Years since quitting: 25.0  . Smokeless tobacco: Never Used  Substance and Sexual Activity  . Alcohol use: Yes    Alcohol/week: 0.0 standard drinks    Comment: Very rare  . Drug use: Not Currently  . Sexual activity: Yes  Other Topics Concern  . Not on file  Social History Narrative   Pt lives with his wife. Lives in a three story homes, no issues with stairs. Some college   Social Determinants of Health   Financial Resource Strain:   . Difficulty of Paying Living Expenses: Not on file  Food Insecurity:   . Worried About Programme researcher, broadcasting/film/video in the Last Year: Not on file   . Ran Out of Food in the Last Year: Not on file  Transportation Needs:   . Lack of Transportation (Medical): Not on file  . Lack of Transportation (Non-Medical): Not on file  Physical Activity:   . Days of Exercise per Week: Not on file  . Minutes of Exercise per Session: Not on file  Stress:   . Feeling of Stress : Not on file  Social Connections:   . Frequency of Communication with Friends and Family: Not on file  . Frequency of Social Gatherings with Friends and Family: Not on file  . Attends Religious Services: Not on file  . Active Member of Clubs or Organizations: Not on file  . Attends Banker Meetings: Not on file  . Marital Status: Not on file   Family History  Problem Relation Age of Onset  . Migraines Mother        Living  . Healthy Father        Living  . Heart failure Maternal Grandmother   . Healthy Daughter        x1  . Allergies Son        x1  . Stomach cancer Other        Great uncle  . Colon  cancer Neg Hx    Allergies  Allergen Reactions  . Hydrocodone Nausea And Vomiting  . Oxycodone Nausea And Vomiting   Prior to Admission medications   Medication Sig Start Date End Date Taking? Authorizing Provider  diazepam (VALIUM) 10 MG tablet TAKE 1/2-1 TABLET BY MOUTH EVERY NIGHT AT BEDTIME AS NEEDED FOR SLEEP 08/13/17  Yes Brunetta Jeans, PA-C  famotidine (PEPCID) 20 MG tablet Take 1 tablet (20 mg total) by mouth daily. 08/18/19  Yes Noralyn Pick, NP  fluticasone (FLONASE) 50 MCG/ACT nasal spray Place 2 sprays into both nostrils daily. 06/04/19  Yes Martin, Mary-Margaret, FNP  loperamide (IMODIUM) 2 MG capsule Take by mouth daily.   Yes [provider]     Positive ROS: Otherwise negative  All other systems have been reviewed and were otherwise negative with the exception of those mentioned in the HPI and as above.  Physical Exam: Constitutional: Alert, well-appearing, no acute distress Ears: External ears without  lesions or tenderness. Ear canals are clear bilaterally with intact, clear TMs.  Nasal: External nose without lesions. Septum with moderate deviation.  Diffuse mucosal swelling bilaterally right side worse than left.. Clear nasal passages with no polyps.  On nasal endoscopy both middle meatus regions were clear with no evidence of mucopurulent discharge and no polyps or significant edema noted. Oral: Lips and gums without lesions. Tongue and palate mucosa without lesions. Posterior oropharynx clear. Neck: No palpable adenopathy or masses Respiratory: Breathing comfortably  Skin: No facial/neck lesions or rash noted.  Nasal/sinus endoscopy  Date/Time: 09/15/2019 5:58 PM Performed by: Rozetta Nunnery, MD Authorized by: Rozetta Nunnery, MD   Consent:    Consent obtained:  Verbal   Consent given by:  Patient   Risks discussed:  Pain Procedure details:    Indications: airway obstruction and sino-nasal symptoms     Instrument: flexible fiberoptic nasal endoscope     Scope location: bilateral nare   Nasal cavity:    edema     edema   Septum:    Deviation: deviated to the right     Severity of deviation: intermediate   Sinus:    patent     patent     Right nasopharynx: normal     Left nasopharynx: normal   Comments:     Moderate mucosal edema with moderate septal deviation.  No polyps noted.  Both middle meatus are clear on endoscopy.    Assessment: Chronic rhinitis with septal deviation Rhinitis medicamentosa as he is using decongestant spray on a regular basis in order to breathe.  Plan: Prescribed Nasacort 2 sprays each nostril at night.  Also prescribed steroids for 5 days. Recommended tapering off the decongestant nasal spray over the next 2 weeks. Suggested use of saline nasal irrigations to help with the postnasal drainage. If he continues to have problems in the next 5 to 6 weeks he will call us back to schedule a CT scan of the sinuses prior to any surgical  intervention which would probably be beneficial to him in the long run.   Radene Journey, MD   CC:

## 2019-11-22 DIAGNOSIS — Z23 Encounter for immunization: Secondary | ICD-10-CM | POA: Diagnosis not present

## 2020-01-19 ENCOUNTER — Encounter: Payer: Self-pay | Admitting: Physician Assistant

## 2020-01-21 ENCOUNTER — Encounter: Payer: Self-pay | Admitting: Physician Assistant

## 2020-01-21 ENCOUNTER — Other Ambulatory Visit: Payer: Self-pay

## 2020-01-21 ENCOUNTER — Ambulatory Visit: Payer: Federal, State, Local not specified - PPO | Admitting: Physician Assistant

## 2020-01-21 VITALS — BP 118/76 | HR 71 | Temp 99.5°F | Resp 16 | Wt 221.6 lb

## 2020-01-21 DIAGNOSIS — M545 Low back pain, unspecified: Secondary | ICD-10-CM

## 2020-01-21 MED ORDER — CYCLOBENZAPRINE HCL 10 MG PO TABS: 10.0000 mg | ORAL_TABLET | Freq: Every day | ORAL | 0 refills | Status: DC

## 2020-01-21 MED ORDER — MELOXICAM 15 MG PO TABS: 15.0000 mg | ORAL_TABLET | Freq: Every day | ORAL | 0 refills | Status: DC

## 2020-01-21 NOTE — Patient Instructions (Signed)
Please avoid heavy lifting or overexertion. I am putting you on restrictions at work for the next 2 weeks -- no pushing, pulling or lifting > 20 pounds.   Take the Meloxicam once daily with food. Can use tylenol for breakthrough pain. Take the Flexeril at nighttime as directed. Apply heating pad to the lower back for 10-15 minutes a few times per day.  Let me know if symptoms are not significantly improved/resolved over the next 7-10 days, if anything worsens or new symptoms develop as we will need to proceed with imaging and further management.   Hang in there!

## 2020-01-21 NOTE — Progress Notes (Signed)
Patient presents to clinic today c/o low back pain.  Patient endorses about a month and half ago he pulled something in his lower back while lifting a heavy object.  Noticed pain for several days that resolved with rest.  Has been doing very well up until this past week, noting Monday he had extreme left lower back pain after lifting a door.  States this is eased up but he still has pain in bilateral lower back, nonradiating.  Is worse with movement and alleviated with rest.  Denies any change to bowel or bladder habits.  Denies any numbness, tingling or weakness of lower extremity.  Did take an expired tablet of naproxen which seemed to help somewhat with his symptoms.  Otherwise has not taken anything for his symptoms.  Past Medical History:  Diagnosis Date  . Frequent headaches   . History of chicken pox   . Migraine   . Neuropathy    Right foot    Current Outpatient Medications on File Prior to Visit  Medication Sig Dispense Refill  . diazepam (VALIUM) 10 MG tablet TAKE 1/2-1 TABLET BY MOUTH EVERY NIGHT AT BEDTIME AS NEEDED FOR SLEEP 30 tablet 0  . famotidine (PEPCID) 20 MG tablet Take 1 tablet (20 mg total) by mouth daily. 90 tablet 1  . fluticasone (FLONASE) 50 MCG/ACT nasal spray Place 2 sprays into both nostrils daily. 16 g 6  . loperamide (IMODIUM) 2 MG capsule Take by mouth daily.     No current facility-administered medications on file prior to visit.    Allergies  Allergen Reactions  . Hydrocodone Nausea And Vomiting  . Oxycodone Nausea And Vomiting    Family History  Problem Relation Age of Onset  . Migraines Mother        Living  . Healthy Father        Living  . Heart failure Maternal Grandmother   . Healthy Daughter        x1  . Allergies Son        x1  . Stomach cancer Other        Great uncle  . Colon cancer Neg Hx     Social History   Socioeconomic History  . Marital status: Married    Spouse name: Not on file  . Number of children: 2  . Years  of education: Not on file  . Highest education level: Not on file  Occupational History  . Not on file  Tobacco Use  . Smoking status: Former Smoker    Types: Cigarettes    Quit date: 1996    Years since quitting: 25.3  . Smokeless tobacco: Never Used  Substance and Sexual Activity  . Alcohol use: Yes    Alcohol/week: 0.0 standard drinks    Comment: Very rare  . Drug use: Not Currently  . Sexual activity: Yes  Other Topics Concern  . Not on file  Social History Narrative   Pt lives with his wife. Lives in a three story homes, no issues with stairs. Some college   Social Determinants of Health   Financial Resource Strain:   . Difficulty of Paying Living Expenses:   Food Insecurity:   . Worried About Programme researcher, broadcasting/film/video in the Last Year:   . Barista in the Last Year:   Transportation Needs:   . Freight forwarder (Medical):   Marland Kitchen Lack of Transportation (Non-Medical):   Physical Activity:   . Days of Exercise per Week:   .  Minutes of Exercise per Session:   Stress:   . Feeling of Stress :   Social Connections:   . Frequency of Communication with Friends and Family:   . Frequency of Social Gatherings with Friends and Family:   . Attends Religious Services:   . Active Member of Clubs or Organizations:   . Attends Archivist Meetings:   Marland Kitchen Marital Status:    Review of Systems - See HPI.  All other ROS are negative.  Wt 221 lb 9.6 oz (100.5 kg)   BMI 30.91 kg/m   Physical Exam Vitals reviewed.  Constitutional:      Appearance: Normal appearance.  HENT:     Head: Normocephalic and atraumatic.  Cardiovascular:     Rate and Rhythm: Normal rate and regular rhythm.     Heart sounds: Normal heart sounds.  Pulmonary:     Effort: Pulmonary effort is normal.  Musculoskeletal:     Cervical back: Neck supple.     Lumbar back: Spasms and tenderness present. No swelling, edema or bony tenderness. Normal range of motion.     Comments: Pain in lumbar  region with flexion and lateral bending  Neurological:     Mental Status: He is alert.    Assessment/Plan: 1. Bilateral low back pain without sciatica, unspecified chronicity Initially felt to be chronic but after further evaluation seems to have been 2 separate acute issues, taking place several weeks apart.  No alarm signs or symptoms present.  No bony tenderness noted on examination.  Will start patient on meloxicam once daily with food.  Tylenol for breakthrough pain.  Flexeril 10 mg nightly as directed.  Avoid heavy lifting or overexertion.  Work restrictions put into effect for the next 2 weeks.  Work note written to reflect this.  Patient to follow-up if symptoms or not resolving or if there is any further recurrence of symptoms as this would prompt need for imaging.  Patient voiced understanding and agreement with the plan. - meloxicam (MOBIC) 15 MG tablet; Take 1 tablet (15 mg total) by mouth daily.  Dispense: 30 tablet; Refill: 0 - cyclobenzaprine (FLEXERIL) 10 MG tablet; Take 1 tablet (10 mg total) by mouth at bedtime.  Dispense: 15 tablet; Refill: 0  This visit occurred during the SARS-CoV-2 public health emergency.  Safety protocols were in place, including screening questions prior to the visit, additional usage of staff PPE, and extensive cleaning of exam room while observing appropriate contact time as indicated for disinfecting solutions.     Leeanne Rio, PA-C

## 2020-02-20 ENCOUNTER — Other Ambulatory Visit: Payer: Self-pay

## 2020-02-20 DIAGNOSIS — M545 Low back pain, unspecified: Secondary | ICD-10-CM

## 2020-02-20 MED ORDER — MELOXICAM 15 MG PO TABS: 15.0000 mg | ORAL_TABLET | Freq: Every day | ORAL | 0 refills | Status: DC

## 2020-03-22 ENCOUNTER — Encounter: Payer: Self-pay | Admitting: Emergency Medicine

## 2020-03-22 ENCOUNTER — Other Ambulatory Visit: Payer: Self-pay | Admitting: Physician Assistant

## 2020-03-22 DIAGNOSIS — M545 Low back pain, unspecified: Secondary | ICD-10-CM

## 2020-03-22 MED ORDER — MELOXICAM 15 MG PO TABS: 15.0000 mg | ORAL_TABLET | Freq: Every day | ORAL | 0 refills | Status: DC

## 2020-05-31 DIAGNOSIS — K080 Exfoliation of teeth due to systemic causes: Secondary | ICD-10-CM | POA: Diagnosis not present

## 2020-07-05 DIAGNOSIS — Z3141 Encounter for fertility testing: Secondary | ICD-10-CM | POA: Diagnosis not present

## 2020-07-06 DIAGNOSIS — Z3141 Encounter for fertility testing: Secondary | ICD-10-CM | POA: Diagnosis not present

## 2020-08-23 DIAGNOSIS — Z113 Encounter for screening for infections with a predominantly sexual mode of transmission: Secondary | ICD-10-CM | POA: Diagnosis not present

## 2020-08-23 DIAGNOSIS — Z1159 Encounter for screening for other viral diseases: Secondary | ICD-10-CM | POA: Diagnosis not present

## 2020-08-23 DIAGNOSIS — Z01812 Encounter for preprocedural laboratory examination: Secondary | ICD-10-CM | POA: Diagnosis not present

## 2020-08-23 DIAGNOSIS — Z114 Encounter for screening for human immunodeficiency virus [HIV]: Secondary | ICD-10-CM | POA: Diagnosis not present

## 2020-09-06 ENCOUNTER — Encounter: Payer: Federal, State, Local not specified - PPO | Admitting: Physician Assistant

## 2020-09-27 ENCOUNTER — Encounter: Payer: Federal, State, Local not specified - PPO | Admitting: Physician Assistant

## 2020-10-04 ENCOUNTER — Encounter: Payer: Self-pay | Admitting: Physician Assistant

## 2020-10-04 ENCOUNTER — Other Ambulatory Visit: Payer: Self-pay

## 2020-10-04 ENCOUNTER — Ambulatory Visit (INDEPENDENT_AMBULATORY_CARE_PROVIDER_SITE_OTHER): Payer: Federal, State, Local not specified - PPO | Admitting: Physician Assistant

## 2020-10-04 VITALS — BP 100/70 | HR 82 | Temp 98.1°F | Resp 16 | Ht 71.0 in | Wt 206.0 lb

## 2020-10-04 DIAGNOSIS — Z1159 Encounter for screening for other viral diseases: Secondary | ICD-10-CM | POA: Diagnosis not present

## 2020-10-04 DIAGNOSIS — Z0000 Encounter for general adult medical examination without abnormal findings: Secondary | ICD-10-CM

## 2020-10-04 DIAGNOSIS — Z125 Encounter for screening for malignant neoplasm of prostate: Secondary | ICD-10-CM | POA: Diagnosis not present

## 2020-10-04 LAB — LIPID PANEL
Cholesterol: 218 mg/dL — ABNORMAL HIGH (ref 0–200)
HDL: 47.8 mg/dL (ref >39.00)
LDL Cholesterol: 132 mg/dL — ABNORMAL HIGH (ref 0–99)
NonHDL: 169.91
Total CHOL/HDL Ratio: 5
Triglycerides: 192 mg/dL — ABNORMAL HIGH (ref 0.0–149.0)
VLDL: 38.4 mg/dL (ref 0.0–40.0)

## 2020-10-04 LAB — PSA: PSA: 0.23 ng/mL (ref 0.10–4.00)

## 2020-10-04 LAB — HEMOGLOBIN A1C: Hgb A1c MFr Bld: 5.7 % (ref 4.6–6.5)

## 2020-10-04 LAB — CBC WITH DIFFERENTIAL/PLATELET
Basophils Absolute: 0.1 10*3/uL (ref 0.0–0.1)
Basophils Relative: 1.1 % (ref 0.0–3.0)
Eosinophils Absolute: 0.1 10*3/uL (ref 0.0–0.7)
Eosinophils Relative: 2 % (ref 0.0–5.0)
HCT: 45.2 % (ref 39.0–52.0)
Hemoglobin: 15.3 g/dL (ref 13.0–17.0)
Lymphocytes Relative: 16 % (ref 12.0–46.0)
Lymphs Abs: 0.9 10*3/uL (ref 0.7–4.0)
MCHC: 33.8 g/dL (ref 30.0–36.0)
MCV: 90 fl (ref 78.0–100.0)
Monocytes Absolute: 0.5 10*3/uL (ref 0.1–1.0)
Monocytes Relative: 9.3 % (ref 3.0–12.0)
Neutro Abs: 4 10*3/uL (ref 1.4–7.7)
Neutrophils Relative %: 71.6 % (ref 43.0–77.0)
Platelets: 235 10*3/uL (ref 150.0–400.0)
RBC: 5.02 Mil/uL (ref 4.22–5.81)
RDW: 13.2 % (ref 11.5–15.5)
WBC: 5.6 10*3/uL (ref 4.0–10.5)

## 2020-10-04 LAB — COMPREHENSIVE METABOLIC PANEL
ALT: 25 U/L (ref 0–53)
AST: 23 U/L (ref 0–37)
Albumin: 4.5 g/dL (ref 3.5–5.2)
Alkaline Phosphatase: 79 U/L (ref 39–117)
BUN: 16 mg/dL (ref 6–23)
CO2: 31 mEq/L (ref 19–32)
Calcium: 9.7 mg/dL (ref 8.4–10.5)
Chloride: 104 mEq/L (ref 96–112)
Creatinine, Ser: 1.22 mg/dL (ref 0.40–1.50)
GFR: 70.82 mL/min (ref >60.00)
Glucose, Bld: 73 mg/dL (ref 70–99)
Potassium: 4.1 mEq/L (ref 3.5–5.1)
Sodium: 141 mEq/L (ref 135–145)
Total Bilirubin: 0.7 mg/dL (ref 0.2–1.2)
Total Protein: 7 g/dL (ref 6.0–8.3)

## 2020-10-04 NOTE — Patient Instructions (Signed)
Please go to the lab for blood work.   Our office will call you with your results unless you have chosen to receive results via MyChart.  If your blood work is normal we will follow-up each year for physicals and as scheduled for chronic medical problems.  If anything is abnormal we will treat accordingly and get you in for a follow-up.   Preventive Care 67-48 Years Old, Male Preventive care refers to lifestyle choices and visits with your health care provider that can promote health and wellness. This includes:  A yearly physical exam. This is also called an annual wellness visit.  Regular dental and eye exams.  Immunizations.  Screening for certain conditions.  Healthy lifestyle choices, such as: ? Eating a healthy diet. ? Getting regular exercise. ? Not using drugs or products that contain nicotine and tobacco. ? Limiting alcohol use. What can I expect for my preventive care visit? Physical exam Your health care provider will check your:  Height and weight. These may be used to calculate your BMI (body mass index). BMI is a measurement that tells if you are at a healthy weight.  Heart rate and blood pressure.  Body temperature.  Skin for abnormal spots. Counseling Your health care provider may ask you questions about your:  Past medical problems.  Family's medical history.  Alcohol, tobacco, and drug use.  Emotional well-being.  Home life and relationship well-being.  Sexual activity.  Diet, exercise, and sleep habits.  Work and work Statistician.  Access to firearms. What immunizations do I need? Vaccines are usually given at various ages, according to a schedule. Your health care provider will recommend vaccines for you based on your age, medical history, and lifestyle or other factors, such as travel or where you work.   What tests do I need? Blood tests  Lipid and cholesterol levels. These may be checked every 5 years, or more often if you are over  59 years old.  Hepatitis C test.  Hepatitis B test. Screening  Lung cancer screening. You may have this screening every year starting at age 35 if you have a 30-pack-year history of smoking and currently smoke or have quit within the past 15 years.  Prostate cancer screening. Recommendations will vary depending on your family history and other risks.  Genital exam to check for testicular cancer or hernias.  Colorectal cancer screening. ? All adults should have this screening starting at age 72 and continuing until age 67. ? Your health care provider may recommend screening at age 67 if you are at increased risk. ? You will have tests every 1-10 years, depending on your results and the type of screening test.  Diabetes screening. ? This is done by checking your blood sugar (glucose) after you have not eaten for a while (fasting). ? You may have this done every 1-3 years.  STD (sexually transmitted disease) testing, if you are at risk. Follow these instructions at home: Eating and drinking  Eat a diet that includes fresh fruits and vegetables, whole grains, lean protein, and low-fat dairy products.  Take vitamin and mineral supplements as recommended by your health care provider.  Do not drink alcohol if your health care provider tells you not to drink.  If you drink alcohol: ? Limit how much you have to 0-2 drinks a day. ? Be aware of how much alcohol is in your drink. In the [place] one drink equals one 12 oz bottle of beer (355 mL), one 5 oz glass  of wine (148 mL), or one 1 oz glass of hard liquor (44 mL).   Lifestyle  Take daily care of your teeth and gums. Brush your teeth every morning and night with fluoride toothpaste. Floss one time each day.  Stay active. Exercise for at least 30 minutes 5 or more days each week.  Do not use any products that contain nicotine or tobacco, such as cigarettes, e-cigarettes, and chewing tobacco. If you need help quitting, ask your health  care provider.  Do not use drugs.  If you are sexually active, practice safe sex. Use a condom or other form of protection to prevent STIs (sexually transmitted infections).  If told by your health care provider, take low-dose aspirin daily starting at age 50.  Find healthy ways to cope with stress, such as: ? Meditation, yoga, or listening to music. ? Journaling. ? Talking to a trusted person. ? Spending time with friends and family. Safety  Always wear your seat belt while driving or riding in a vehicle.  Do not drive: ? If you have been drinking alcohol. Do not ride with someone who has been drinking. ? When you are tired or distracted. ? While texting.  Wear a helmet and other protective equipment during sports activities.  If you have firearms in your house, make sure you follow all gun safety procedures. What's next?  Go to your health care provider once a year for an annual wellness visit.  Ask your health care provider how often you should have your eyes and teeth checked.  Stay up to date on all vaccines. This information is not intended to replace advice given to you by your health care provider. Make sure you discuss any questions you have with your health care provider. Document Revised: 05/27/2019 Document Reviewed: 08/22/2018 Elsevier Patient Education  2021 Elsevier Inc. .      

## 2020-10-04 NOTE — Progress Notes (Signed)
Patient presents to clinic today for annual exam.  Patient is fasting for labs. Patient started with dietary changes about 6 months ago. Notes better food choices and increasing aerobic exercise. Cut out sodas. Down almost 20 pounds.   Acute Concerns: Denies acute concerns at today's visit.   Health Maintenance: Immunizations -- Declines flu shot.  Colon Cancer Screeing -- up-to-date  Past Medical History:  Diagnosis Date  . Frequent headaches   . History of chicken pox   . Migraine   . Neuropathy    Right foot    Past Surgical History:  Procedure Laterality Date  . pinched nerve foot     Left  . SHOULDER ARTHROSCOPY Left 08/2018  . WISDOM TOOTH EXTRACTION      Current Outpatient Medications on File Prior to Visit  Medication Sig Dispense Refill  . diazepam (VALIUM) 10 MG tablet TAKE 1/2-1 TABLET BY MOUTH EVERY NIGHT AT BEDTIME AS NEEDED FOR SLEEP 30 tablet 0  . fluticasone (FLONASE) 50 MCG/ACT nasal spray Place 2 sprays into both nostrils daily. 16 g 6  . loperamide (IMODIUM) 2 MG capsule Take by mouth daily.     No current facility-administered medications on file prior to visit.    Allergies  Allergen Reactions  . Hydrocodone Nausea And Vomiting and Nausea Only  . Oxycodone Nausea And Vomiting and Nausea Only    Family History  Problem Relation Age of Onset  . Migraines Mother        Living  . Healthy Father        Living  . Heart failure Maternal Grandmother   . Healthy Daughter        x1  . Allergies Son        x1  . Stomach cancer Other        Great uncle  . Colon cancer Neg Hx     Social History   Socioeconomic History  . Marital status: Married    Spouse name: Not on file  . Number of children: 2  . Years of education: Not on file  . Highest education level: Not on file  Occupational History  . Not on file  Tobacco Use  . Smoking status: Former Smoker    Types: Cigarettes    Quit date: 1996    Years since quitting: 26.0  .  Smokeless tobacco: Never Used  Vaping Use  . Vaping Use: Never used  Substance and Sexual Activity  . Alcohol use: Yes    Alcohol/week: 0.0 standard drinks    Comment: Very rare  . Drug use: Not Currently  . Sexual activity: Yes  Other Topics Concern  . Not on file  Social History Narrative   Pt lives with his wife. Lives in a three story homes, no issues with stairs. Some college   Social Determinants of Health   Financial Resource Strain: Not on file  Food Insecurity: Not on file  Transportation Needs: Not on file  Physical Activity: Not on file  Stress: Not on file  Social Connections: Not on file  Intimate Partner Violence: Not on file   Review of Systems  Constitutional: Negative for fever and weight loss.  HENT: Negative for ear discharge, ear pain, hearing loss and tinnitus.   Eyes: Negative for blurred vision, double vision, photophobia and pain.  Respiratory: Negative for cough and shortness of breath.   Cardiovascular: Negative for chest pain and palpitations.  Gastrointestinal: Negative for abdominal pain, blood in stool, constipation, diarrhea, heartburn,  melena, nausea and vomiting.  Genitourinary: Negative for dysuria, flank pain, frequency, hematuria and urgency.  Musculoskeletal: Negative for falls.  Neurological: Negative for dizziness, loss of consciousness and headaches.  Endo/Heme/Allergies: Negative for environmental allergies.  Psychiatric/Behavioral: Negative for depression, hallucinations, substance abuse and suicidal ideas. The patient is not nervous/anxious and does not have insomnia.    BP 100/70   Pulse 82   Temp 98.1 F (36.7 C) (Temporal)   Resp 16   Ht 5\' 11"  (1.803 m)   Wt 206 lb (93.4 kg)   SpO2 98%   BMI 28.73 kg/m   Physical Exam Vitals reviewed.  Constitutional:      General: He is not in acute distress.    Appearance: He is well-developed and well-nourished. He is not diaphoretic.  HENT:     Head: Normocephalic and atraumatic.      Right Ear: Tympanic membrane, ear canal and external ear normal.     Left Ear: Tympanic membrane, ear canal and external ear normal.     Nose: Nose normal.     Mouth/Throat:     Mouth: Oropharynx is clear and moist and mucous membranes are normal.     Pharynx: No posterior oropharyngeal edema or posterior oropharyngeal erythema.  Eyes:     Conjunctiva/sclera: Conjunctivae normal.     Pupils: Pupils are equal, round, and reactive to light.  Neck:     Thyroid: No thyromegaly.  Cardiovascular:     Rate and Rhythm: Normal rate and regular rhythm.     Pulses: Intact distal pulses.     Heart sounds: Normal heart sounds.  Pulmonary:     Effort: Pulmonary effort is normal. No respiratory distress.     Breath sounds: Normal breath sounds. No wheezing or rales.  Chest:     Chest wall: No tenderness.  Abdominal:     General: Bowel sounds are normal. There is no distension.     Palpations: Abdomen is soft. There is no mass.     Tenderness: There is no abdominal tenderness. There is no guarding or rebound.  Musculoskeletal:     Cervical back: Neck supple.  Lymphadenopathy:     Cervical: No cervical adenopathy.  Skin:    General: Skin is warm and dry.     Findings: No rash.  Neurological:     Mental Status: He is alert and oriented to person, place, and time.     Cranial Nerves: No cranial nerve deficit.  Psychiatric:        Mood and Affect: Mood and affect normal.    Assessment/Plan: 1. Visit for preventive health examination Depression screen negative. Health Maintenance reviewed --declines flu shot today. Preventive schedule discussed and handout given in AVS. Will obtain fasting labs today.  - CBC with Differential/Platelet - Comprehensive metabolic panel - Hemoglobin A1c - Lipid panel  2. Prostate cancer screening  He indicates understanding of the limitations of this screening test and wishes to proceed with screening PSA testing.  - PSA  3. Need for hepatitis C  screening test - Hepatitis C Antibody    This visit occurred during the SARS-CoV-2 public health emergency.  Safety protocols were in place, including screening questions prior to the visit, additional usage of staff PPE, and extensive cleaning of exam room while observing appropriate contact time as indicated for disinfecting solutions.    , PA-C

## 2020-10-05 LAB — HEPATITIS C ANTIBODY
Hepatitis C Ab: NONREACTIVE
SIGNAL TO CUT-OFF: 0.01 (ref <1.00)

## 2020-11-04 ENCOUNTER — Telehealth (INDEPENDENT_AMBULATORY_CARE_PROVIDER_SITE_OTHER): Payer: Federal, State, Local not specified - PPO | Admitting: Physician Assistant

## 2020-11-04 ENCOUNTER — Encounter: Payer: Self-pay | Admitting: Physician Assistant

## 2020-11-04 DIAGNOSIS — R6889 Other general symptoms and signs: Secondary | ICD-10-CM | POA: Diagnosis not present

## 2020-11-04 NOTE — Patient Instructions (Signed)
Please have labs checked at your convenience and I will discuss results with you in MyChart.

## 2020-11-04 NOTE — Progress Notes (Signed)
Virtual Visit via Video Note  I connected with Scott Cummings on 11/04/20 at 11:30 AM EST by a video enabled telemedicine application and verified that I am speaking with the correct person using two identifiers.  Location: Patient: outside at work Provider: Nature conservation officer at [employer]   I discussed the limitations of evaluation and management by telemedicine and the availability of in person appointments. The patient expressed understanding and agreed to proceed.   Only the patient and myself were present for today's video call.   History of Present Illness: Patient would like orders to have his thyroid levels checked. His body temperature goes low a few times a year. 97.1 F was the lowest he has seen. States that he feels like he is running a fever, but instead it is a low reading for him.  No family hx of diabetes or thyroid issues.  He denies any issues with constipation, excessive fatigue, or trouble losing weight.   Observations/Objective:    Gen: Awake, alert, no acute distress Resp: Breathing is even and non-labored Psych: calm/pleasant demeanor Neuro: Alert and Oriented x 3, + facial symmetry, speech is clear.   Assessment and Plan:  1. Low body temperature I will put in standing orders for TSH, T3, and T4, although I will be surprised if these are abnormal. Last TSH was normal in 07/2016. Just had annual CPE done with Malva Cogan, PA-C last month and all labs were normal, including his exam, but TSH was not checked.   Follow Up Instructions:    I discussed the assessment and treatment plan with the patient. The patient was provided an opportunity to ask questions and all were answered. The patient agreed with the plan and demonstrated an understanding of the instructions.   The patient was advised to call back or seek an in-person evaluation if the symptoms worsen or if the condition fails to improve as anticipated.  [name], PA-C

## 2020-11-05 ENCOUNTER — Other Ambulatory Visit (INDEPENDENT_AMBULATORY_CARE_PROVIDER_SITE_OTHER): Payer: Federal, State, Local not specified - PPO

## 2020-11-05 ENCOUNTER — Other Ambulatory Visit: Payer: Self-pay

## 2020-11-05 DIAGNOSIS — R6889 Other general symptoms and signs: Secondary | ICD-10-CM | POA: Diagnosis not present

## 2020-11-06 LAB — THYROID PANEL WITH TSH
Free Thyroxine Index: 2 (ref 1.4–3.8)
T3 Uptake: 31 % (ref 22–35)
T4, Total: 6.6 ug/dL (ref 4.9–10.5)
TSH: 1.41 mIU/L (ref 0.40–4.50)

## 2020-11-11 ENCOUNTER — Ambulatory Visit: Payer: Federal, State, Local not specified - PPO | Admitting: Family Medicine

## 2021-01-03 ENCOUNTER — Encounter: Payer: Federal, State, Local not specified - PPO | Admitting: Family Medicine

## 2021-02-04 ENCOUNTER — Ambulatory Visit: Payer: Federal, State, Local not specified - PPO | Admitting: Family Medicine

## 2021-02-04 ENCOUNTER — Other Ambulatory Visit: Payer: Self-pay

## 2021-02-04 ENCOUNTER — Encounter: Payer: Self-pay | Admitting: Family Medicine

## 2021-02-04 VITALS — BP 108/70 | HR 77 | Temp 97.6°F | Ht 72.0 in | Wt 216.0 lb

## 2021-02-04 DIAGNOSIS — G4700 Insomnia, unspecified: Secondary | ICD-10-CM | POA: Insufficient documentation

## 2021-02-04 DIAGNOSIS — K580 Irritable bowel syndrome with diarrhea: Secondary | ICD-10-CM

## 2021-02-04 DIAGNOSIS — G44209 Tension-type headache, unspecified, not intractable: Secondary | ICD-10-CM

## 2021-02-04 DIAGNOSIS — F5104 Psychophysiologic insomnia: Secondary | ICD-10-CM | POA: Diagnosis not present

## 2021-02-04 DIAGNOSIS — L819 Disorder of pigmentation, unspecified: Secondary | ICD-10-CM | POA: Insufficient documentation

## 2021-02-04 NOTE — Progress Notes (Signed)
[facility] [address] [place] [address] Dept: [phone] Dept Fax: [phone]  Transfer of Care Office Visit  Subjective:    Patient ID: Scott Cummings, male    DOB: [birth year], 50 y.o..   MRN: [id number]  Chief Complaint  Patient presents with  . Establish Care    TOC-  establish care.  C/o having low body temp x months and also having severe Ha's on/off x 4 months.       History of Present Illness:  Patient is in today to establish care. Scott Cummings is originally from [place], [place]. He has lived int he [place] area for about 21 years. He works as an Multimedia programmer for [employer] He has two children form a previous marriage (28, 66). His current wife is pregnant and due in October. He denies any tobacco or drug use. He does drink alcohol 1-2 times a month.   Scott Cummings has a history of tension headaches. He complains today of intermittent severe headaches, esp. more recently. He notes that he has milder headaches that respond to OTC Excedrin Tension. However, these recent more severe headaches do not typically go away with treatment, only with time. He did have such a headache recently that was quite bothersome. It occurred during a time when he was purchasing a motorcycle. He notes that after he completed the purchase, the headache quickly resolved. He admits that stress likely played a factor in this. He does have a family history of his father have a cerebral aneurysm. He finds that he does worry about this and whether his headaches might relate to this.   Scott Cummings also brings up a concern about his headaches potentially being associated with a low body temperature. He notes for years, his temperature was always 98.6 F. However, over the past several months, he has found that his temperature is "low". He states he considers low when his temperature is closer to 97 F. He was seen for a video visit earlier this year  about his concerns for a low body temperature and did have thyroid testing, which was normal. He denies any signs of cold intolerance.  Scott Cummings has a history of irritable bowel syndrome of the diarrhea type. He notes he has 8-10 bowel movements a day. He intermittently uses loperamide, esp. when he is going ot be away from home. He had a colonoscopy in the past for assessment. He was placed on a newer medication (? alosetron), which helped for about a week.   Scott Cummings is prescribed Valium 10 mg qhs for issues he has with racing thoughts that keep him form sleeping. When I asked him about insomnia, he stated he doesn't;t have that, he has racing thoughts. He notes he was previously on Xanax, but that he switched to Valium, as the Xanax seemed ot give him daytime sleepiness as well. He notes he uses this 1-2 times a week, though is wife seemed to feel he was using this more often recently.  Scott Cummings has a history of joint issues. He has had some prior left knee pain that bothers him fairly frequently, esp. with activity. His chart indicates a history fo patellofemoral syndrome. He has had a prior left should arthroscopy for impingement syndrome. He notes this is doing well, but he is starting to have right shoulder issues.  Past Medical History: Patient Active Problem List   Diagnosis Date Noted  . Dyschromia 02/04/2021  . Insomnia 02/04/2021  .  Impingement syndrome of left shoulder region 07/29/2018  . IBS (irritable bowel syndrome) 04/03/2017  . Patellofemoral syndrome 07/30/2015  . Tension headache 06/02/2015  . Abnormal stools 06/02/2015   Past Surgical History:  Procedure Laterality Date  . pinched nerve foot     Left  . SHOULDER ARTHROSCOPY Left 08/2018  . WISDOM TOOTH EXTRACTION     Family History  Problem Relation Age of Onset  . Migraines Mother        Living  . Healthy Father        Living  . Heart failure Maternal Grandmother   . Healthy Daughter        x1  . Allergies  Son        x1  . Stomach cancer Other        Great uncle  . Colon cancer Neg Hx    Outpatient Medications Prior to Visit  Medication Sig Dispense Refill  . Acetaminophen-Caffeine (EXCEDRIN TENSION HEADACHE) 500-65 MG TABS Take by mouth.    . diazepam (VALIUM) 10 MG tablet TAKE 1/2-1 TABLET BY MOUTH EVERY NIGHT AT BEDTIME AS NEEDED FOR SLEEP 30 tablet 0  . loperamide (IMODIUM) 2 MG capsule Take by mouth daily.     No facility-administered medications prior to visit.   Allergies  Allergen Reactions  . Hydrocodone Nausea And Vomiting and Nausea Only  . Oxycodone Nausea And Vomiting and Nausea Only    Objective:   Today's Vitals   02/04/21 0907  BP: 108/70  Pulse: 77  Temp: 97.6 F (36.4 C)  TempSrc: Temporal  SpO2: 95%  Weight: 216 lb (98 kg)  Height: 6' (1.829 m)   Body mass index is 29.29 kg/m.   General: Well developed, well nourished. No acute distress. Psych: Alert and oriented x3. Normal mood and affect.  Health Maintenance Due  Topic Date Due  . HIV Screening  Never done     Assessment & Plan:   1. Irritable bowel syndrome with diarrhea Scott Cummings has moderately severe IBS, managed with periodic Imodium use. He has been tried on newer agents without success. Recommend we continue to follow this for now.  2. Tension headache The recent increase in headaches potentially needs further evaluation. Having a single first-degree relative with a history of aneurysm does not significantly increase the risk for Scott Cummings to have an aneurysm. However, in light of the change in headache pattern, this may warrant an MRI scan to evaluate further. The headaches appear to be primarily stress related. We did discuss the need to address stress issues rather than escalating other higher-risk medications. I will plan to see him back in 1 month for reassessment.  3. Psychophysiological insomnia I discussed with Scott Cummings about the diagnosis of insomnia. In his case, it appears that he  may have an underlying anxiety disorder that is not being managed appropriately. I also attempted to reassure him that his body temperature readings are normal and do not appear to represent some unfounded disease. I believe his hypervigilance of his temperature may be bordering on an obsession or at least a symptom of an anxiety disorder. His intermittent use of Valium for sleep is appropriate for now. I would consider starting him on an SSRI in the future.  Loyola Mast, MD

## 2021-02-21 ENCOUNTER — Encounter: Payer: Federal, State, Local not specified - PPO | Admitting: Family Medicine

## 2021-03-21 ENCOUNTER — Encounter: Payer: Self-pay | Admitting: Family Medicine

## 2021-03-21 ENCOUNTER — Ambulatory Visit: Payer: Federal, State, Local not specified - PPO | Admitting: Family Medicine

## 2021-03-21 ENCOUNTER — Other Ambulatory Visit: Payer: Self-pay

## 2021-03-21 VITALS — BP 124/76 | HR 64 | Temp 97.2°F | Ht 72.0 in | Wt 218.8 lb

## 2021-03-21 DIAGNOSIS — F5104 Psychophysiologic insomnia: Secondary | ICD-10-CM | POA: Diagnosis not present

## 2021-03-21 DIAGNOSIS — M7541 Impingement syndrome of right shoulder: Secondary | ICD-10-CM | POA: Insufficient documentation

## 2021-03-21 DIAGNOSIS — K580 Irritable bowel syndrome with diarrhea: Secondary | ICD-10-CM | POA: Diagnosis not present

## 2021-03-21 DIAGNOSIS — G44209 Tension-type headache, unspecified, not intractable: Secondary | ICD-10-CM

## 2021-03-21 MED ORDER — DIAZEPAM 10 MG PO TABS: ORAL_TABLET | ORAL | 0 refills | Status: DC

## 2021-03-21 NOTE — Progress Notes (Signed)
[facility] [address] [place] [address] Dept: [phone] Dept Fax: [phone]  Office Visit  Subjective:    Patient ID: Scott Cummings, male    DOB: 10/29/1970, 50 y.o..   MRN: [id number]  Chief Complaint  Patient presents with   Follow-up    6 week f/u insomnia.       History of Present Illness:  Patient is in today for reassessment.  Mr. Scott Cummings has a history of tension headaches. He notes that these are doing much better at present. He previously identified that these were often a response to stressful situations and has been doing well in this regard more recently.  Mr. Scott Cummings notes his IBS-D has been overall stable. He continues to have 4+ stools per day. He uses Imodium when he knows he will be away from home. Otherwise he accepts this. he has tried maintaining hydration and exercises regularly. He has attempted dietary changes, but has not found any that reduce his diarrhea.  Mr. Scott Cummings notes his insomnia is stable. He uses Valium about 50% of nights in a given week. He notes that if he awakens at night, he will not take any additional, as it makes him hard to wake up for work. He has not tried a lower dose to see if this might be effective.  Mr. Scott Cummings notes he has had prior left shoulder surgery that was due to some hypertrophy of the left clavicular head at the [facility] joint pressing on his rotator cuff tendons. He underwent an arthroscopic decompression of the joint space. He notes he is having similar issues on the right and will be seeing an orthopedist soon to discuss surgery on that side. This was exacerbated by weight training (dumbbell presses), so he is currently avoiding such exercises.  Past Medical History: Patient Active Problem List   Diagnosis Date Noted   Impingement syndrome of right shoulder 03/21/2021   Dyschromia 02/04/2021   Insomnia 02/04/2021   IBS (irritable bowel syndrome) 04/03/2017    Patellofemoral syndrome 07/30/2015   Tension headache 06/02/2015   Past Surgical History:  Procedure Laterality Date   pinched nerve foot     Left   SHOULDER ARTHROSCOPY Left 08/2018   WISDOM TOOTH EXTRACTION     Family History  Problem Relation Age of Onset   Migraines Mother        Living   Healthy Father        Living   Heart failure Maternal Grandmother    Healthy Daughter        x1   Allergies Son        x1   Stomach cancer Other        Great uncle   Colon cancer Neg Hx    Outpatient Medications Prior to Visit  Medication Sig Dispense Refill   Acetaminophen-Caffeine (EXCEDRIN TENSION HEADACHE) 500-65 MG TABS Take by mouth.     loperamide (IMODIUM) 2 MG capsule Take by mouth daily.     diazepam (VALIUM) 10 MG tablet TAKE 1/2-1 TABLET BY MOUTH EVERY NIGHT AT BEDTIME AS NEEDED FOR SLEEP 30 tablet 0   No facility-administered medications prior to visit.   Allergies  Allergen Reactions   Hydrocodone Nausea And Vomiting and Nausea Only   Oxycodone Nausea And Vomiting and Nausea Only     Objective:   Today's Vitals   03/21/21 1400  BP: 124/76  Pulse: 64  Temp: (!) 97.2 F (36.2 C)  TempSrc: Temporal  SpO2:  96%  Weight: 218 lb 12.8 oz (99.2 kg)  Height: 6' (1.829 m)   Body mass index is 29.67 kg/m.   General: Well developed, well nourished. No acute distress. Psych: Alert and oriented. Normal mood and affect.  Health Maintenance Due  Topic Date Due   HIV Screening  Never done     Assessment & Plan:   1. Irritable bowel syndrome with diarrhea Stable with intermittent use of loperamide.  2. Tension headache Improved. No need for further imaging or routine medication.  3. Psychophysiological insomnia Stable with Valium use 3-4 nights per week.  - diazepam (VALIUM) 10 MG tablet; TAKE 1/2-1 TABLET BY MOUTH EVERY NIGHT AT BEDTIME AS NEEDED FOR SLEEP  Dispense: 20 tablet; Refill: 0  4. Impingement syndrome of right shoulder Pending orthopedic  evaluation.  Loyola Mast, MD

## 2021-04-06 DIAGNOSIS — M25511 Pain in right shoulder: Secondary | ICD-10-CM | POA: Diagnosis not present

## 2021-04-24 DIAGNOSIS — M25511 Pain in right shoulder: Secondary | ICD-10-CM | POA: Diagnosis not present

## 2021-05-02 DIAGNOSIS — M7541 Impingement syndrome of right shoulder: Secondary | ICD-10-CM | POA: Diagnosis not present

## 2021-05-12 HISTORY — PX: SHOULDER ARTHROSCOPY WITH SUBACROMIAL DECOMPRESSION: SHX5684

## 2021-05-17 DIAGNOSIS — Z4789 Encounter for other orthopedic aftercare: Secondary | ICD-10-CM | POA: Diagnosis not present

## 2021-05-17 DIAGNOSIS — M7541 Impingement syndrome of right shoulder: Secondary | ICD-10-CM | POA: Diagnosis not present

## 2021-05-17 DIAGNOSIS — Z79899 Other long term (current) drug therapy: Secondary | ICD-10-CM | POA: Diagnosis not present

## 2021-05-17 DIAGNOSIS — I890 Lymphedema, not elsewhere classified: Secondary | ICD-10-CM | POA: Diagnosis not present

## 2021-05-17 DIAGNOSIS — M25511 Pain in right shoulder: Secondary | ICD-10-CM | POA: Diagnosis not present

## 2021-05-17 DIAGNOSIS — M19011 Primary osteoarthritis, right shoulder: Secondary | ICD-10-CM | POA: Diagnosis not present

## 2021-05-17 DIAGNOSIS — G8918 Other acute postprocedural pain: Secondary | ICD-10-CM | POA: Diagnosis not present

## 2021-05-27 DIAGNOSIS — M25511 Pain in right shoulder: Secondary | ICD-10-CM | POA: Diagnosis not present

## 2021-06-08 ENCOUNTER — Ambulatory Visit: Payer: Federal, State, Local not specified - PPO | Admitting: Family Medicine

## 2021-06-14 ENCOUNTER — Other Ambulatory Visit: Payer: Self-pay

## 2021-06-15 ENCOUNTER — Ambulatory Visit: Payer: Federal, State, Local not specified - PPO | Admitting: Family Medicine

## 2021-06-15 ENCOUNTER — Encounter: Payer: Self-pay | Admitting: Family Medicine

## 2021-06-15 VITALS — BP 124/76 | HR 77 | Temp 97.7°F | Ht 72.0 in | Wt 223.2 lb

## 2021-06-15 DIAGNOSIS — R0789 Other chest pain: Secondary | ICD-10-CM

## 2021-06-15 NOTE — Patient Instructions (Signed)
Apply hot compresses to the right chest wall int he evenings for 15 min on/30 min off, for 3-4 cycles. Perform stretches of the right chest muscles. Use Voltaren gel topically twice a day for discomfort.

## 2021-06-15 NOTE — Progress Notes (Signed)
[facility] [address] [place] [address] Dept: [phone] Dept Fax: [phone]  Office Visit  Subjective:    Patient ID: Scott Cummings, male    DOB: Jun 05, 1971, 50 y.o..   MRN: [id number]  Chief Complaint  Patient presents with   Acute Visit    C/o having spot on the RT side of chest that is sore  to the touch x 2 weeks.  Declines flu shot.    History of Present Illness:  Patient is in today for right chest pain. He states that 1-2 months ago, he recalls having a day where he had pain int he right chest. This passed after a day, so he didn't think much of it. Three weeks ago, he had arthroscopic surgery of his right shoulder. He has been gradually increasing use of his right arm. However, he notes that his wife recently laid her head on his right chest and he had shooting pain again. He has experienced this one more time since. He notes if he searches around he can find  single point that is tender. He has had some worries for this, as his mother recently had surgery for breast cancer. He denies any fever or cough.  Past Medical History: Patient Active Problem List   Diagnosis Date Noted   Impingement syndrome of right shoulder 03/21/2021   Dyschromia 02/04/2021   Insomnia 02/04/2021   IBS (irritable bowel syndrome) 04/03/2017   Patellofemoral syndrome 07/30/2015   Tension headache 06/02/2015   Past Surgical History:  Procedure Laterality Date   pinched nerve foot     Left   SHOULDER ARTHROSCOPY Left 08/2018   WISDOM TOOTH EXTRACTION     Family History  Problem Relation Age of Onset   Migraines Mother        Living   Healthy Father        Living   Heart failure Maternal Grandmother    Healthy Daughter        x1   Allergies Son        x1   Stomach cancer Other        Great uncle   Colon cancer Neg Hx    Outpatient Medications Prior to Visit  Medication Sig Dispense Refill   Acetaminophen-Caffeine  (EXCEDRIN TENSION HEADACHE) 500-65 MG TABS Take by mouth.     diazepam (VALIUM) 10 MG tablet TAKE 1/2-1 TABLET BY MOUTH EVERY NIGHT AT BEDTIME AS NEEDED FOR SLEEP 20 tablet 0   loperamide (IMODIUM) 2 MG capsule Take by mouth daily.     No facility-administered medications prior to visit.   Allergies  Allergen Reactions   Hydrocodone Nausea And Vomiting and Nausea Only   Oxycodone Nausea And Vomiting and Nausea Only   Naproxen Nausea Only   Objective:   Today's Vitals   06/15/21 0757  BP: 124/76  Pulse: 77  Temp: 97.7 F (36.5 C)  TempSrc: Temporal  SpO2: 96%  Weight: 223 lb 3.2 oz (101.2 kg)  Height: 6' (1.829 m)   Body mass index is 30.27 kg/m.   General: Well developed, well nourished. No acute distress. Chest: There is point tenderness about 2-3 cm lateral to the right areola. This overlies ~ the 4th rib.   There is no palpable mass here or elsewhere in the right chest. There are no adnexal masses. Lungs: Clear to auscultation bilaterally. No wheezing, rales or rhonchi. Psych: Alert and oriented. Normal mood and affect.  Health Maintenance Due  Topic Date  Due   HIV Screening  Never done   INFLUENZA VACCINE  Never done      Assessment & Plan:   1. Right-sided chest wall pain I reassured Scott Cummings that this is likely due to an irritated muscle, possibly a strain. I recommended he uses hot compresses on the chest with stretching of the pectoral muscles (as much as his shoulder will allow). He recently was on an NSAID and had stomach upset from this, so I recommend he try topical Voltaren gel for inflammation. We will observe this for now.  Loyola Mast, MD

## 2021-06-16 DIAGNOSIS — I890 Lymphedema, not elsewhere classified: Secondary | ICD-10-CM | POA: Diagnosis not present

## 2021-06-16 DIAGNOSIS — M19011 Primary osteoarthritis, right shoulder: Secondary | ICD-10-CM | POA: Diagnosis not present

## 2021-06-16 DIAGNOSIS — Z79899 Other long term (current) drug therapy: Secondary | ICD-10-CM | POA: Diagnosis not present

## 2021-06-16 DIAGNOSIS — Z4789 Encounter for other orthopedic aftercare: Secondary | ICD-10-CM | POA: Diagnosis not present

## 2021-06-16 DIAGNOSIS — M25511 Pain in right shoulder: Secondary | ICD-10-CM | POA: Diagnosis not present

## 2021-07-06 ENCOUNTER — Other Ambulatory Visit: Payer: Self-pay | Admitting: Family Medicine

## 2021-07-06 DIAGNOSIS — F5104 Psychophysiologic insomnia: Secondary | ICD-10-CM

## 2021-07-07 NOTE — Telephone Encounter (Signed)
Refill for:  Diazapam 10 mg LR 03/21/21, #30, 0 rf LOV 06/15/21 FOV  none scheduled.    Please review and advise.  Thanks.  Dm/cma

## 2021-08-02 DIAGNOSIS — R0981 Nasal congestion: Secondary | ICD-10-CM | POA: Diagnosis not present

## 2021-08-02 DIAGNOSIS — R051 Acute cough: Secondary | ICD-10-CM | POA: Diagnosis not present

## 2021-08-02 DIAGNOSIS — B349 Viral infection, unspecified: Secondary | ICD-10-CM | POA: Diagnosis not present

## 2021-08-27 ENCOUNTER — Other Ambulatory Visit: Payer: Self-pay | Admitting: Family Medicine

## 2021-08-27 DIAGNOSIS — F5104 Psychophysiologic insomnia: Secondary | ICD-10-CM

## 2021-10-24 ENCOUNTER — Encounter: Payer: Federal, State, Local not specified - PPO | Admitting: Family Medicine

## 2021-10-26 ENCOUNTER — Other Ambulatory Visit: Payer: Self-pay | Admitting: Family Medicine

## 2021-10-26 DIAGNOSIS — F5104 Psychophysiologic insomnia: Secondary | ICD-10-CM

## 2021-11-14 ENCOUNTER — Ambulatory Visit: Payer: Federal, State, Local not specified - PPO | Admitting: Family Medicine

## 2021-11-14 ENCOUNTER — Other Ambulatory Visit: Payer: Self-pay

## 2021-11-14 ENCOUNTER — Encounter: Payer: Self-pay | Admitting: Family Medicine

## 2021-11-14 VITALS — BP 120/78 | HR 88 | Temp 98.7°F | Ht 72.0 in | Wt 214.6 lb

## 2021-11-14 DIAGNOSIS — J029 Acute pharyngitis, unspecified: Secondary | ICD-10-CM

## 2021-11-14 NOTE — Patient Instructions (Signed)
Take a cup of warm water with 2 tablespoons of honey and a sqeeze of lemon 3-4 times a day. ?

## 2021-11-14 NOTE — Progress Notes (Signed)
?[facility] ?[address] ?[place] [address] ?Dept: [phone] ?Dept Fax: [phone] ? ?Office Visit ? ?Subjective:  ? ? Patient ID: Scott Cummings, male    DOB: 1970/12/02, 51 y.o..   MRN: [id number] ? ?Chief Complaint  ?Patient presents with  ? Acute Visit  ?  C/o having stuffing nose, ST x 2 days.  He has been taking Sudafed with little relief.    ? ? ?History of Present Illness: ? ?Patient is in today for 2-day history of sore throat. He has had some nasal congestion, rhinorrhea, and a mild dry cough associated with this. he finds it difficult to use OTC meds, as they tend to make him nauseous. No one else at home is sick. ? ?Past Medical History: ?Patient Active Problem List  ? Diagnosis Date Noted  ? Impingement syndrome of right shoulder 03/21/2021  ? Dyschromia 02/04/2021  ? Insomnia 02/04/2021  ? IBS (irritable bowel syndrome) 04/03/2017  ? Patellofemoral syndrome 07/30/2015  ? Tension headache 06/02/2015  ? ?Past Surgical History:  ?Procedure Laterality Date  ? pinched nerve foot    ? Left  ? SHOULDER ARTHROSCOPY Left 08/2018  ? SHOULDER ARTHROSCOPY WITH SUBACROMIAL DECOMPRESSION Right 05/2021  ? WISDOM TOOTH EXTRACTION    ? ?Family History  ?Problem Relation Age of Onset  ? Cancer Mother   ?     Breast  ? Migraines Mother   ?     Living  ? Healthy Father   ?     Living  ? Heart failure Maternal Grandmother   ? Healthy Daughter   ?     x1  ? Allergies Son   ?     x1  ? Stomach cancer Other   ?     Great uncle  ? Colon cancer Neg Hx   ? ? ?Outpatient Medications Prior to Visit  ?Medication Sig Dispense Refill  ? Acetaminophen-Caffeine (EXCEDRIN TENSION HEADACHE) 500-65 MG TABS Take by mouth.    ? diazepam (VALIUM) 10 MG tablet TAKE 1/2 TO 1 TABLET BY MOUTH EVERY NIGHT AT BEDTIME AS NEEDED FOR SLEEP 20 tablet 1  ? loperamide (IMODIUM) 2 MG capsule Take by mouth daily.    ? ?No facility-administered medications prior to visit.  ? ?Allergies   ?Allergen Reactions  ? Hydrocodone Nausea And Vomiting and Nausea Only  ? Oxycodone Nausea And Vomiting and Nausea Only  ? Naproxen Nausea Only  ? ?   ?Objective:  ? ?Today's Vitals  ? 11/14/21 1338  ?BP: 120/78  ?Pulse: 88  ?Temp: 98.7 ?F (37.1 ?C)  ?TempSrc: Temporal  ?SpO2: 97%  ?Weight: 214 lb 9.6 oz (97.3 kg)  ?Height: 6' (1.829 m)  ? ?Body mass index is 29.1 kg/m?.  ? ?General: Well developed, well nourished. No acute distress. ?HEENT: Normocephalic, non-traumatic. Conjunctiva clear. Nose with mild congestion and  ? rhinorrhea. Mucous membranes moist. Oropharynx clear. Tonsil;s not swollen and no  ? exudates. Good dentition. ?Neck: Supple. No lymphadenopathy. ?Lungs: Clear to auscultation bilaterally. No wheezing, rales or rhonchi. ?CV: RRR without murmurs or rubs. Pulses 2+ bilaterally. ?Psych: Alert and oriented. Normal mood and affect. ? ?Health Maintenance Due  ?Topic Date Due  ? HIV Screening  Never done  ?   ?Assessment & Plan:  ? ?1. Viral pharyngitis ?Modified Centor Score is -1. Strep test is not indicated. Discussed home care for viral illness, including rest, pushing fluids, and using warm water with honey and lemon (patient cannot tolerate tea)  for sore throat symptoms. Follow-up if needed for worsening or persistent symptoms. ? ?Return if symptoms worsen or fail to improve. ? ?Loyola Mast, MD ?

## 2021-11-15 ENCOUNTER — Ambulatory Visit: Payer: Federal, State, Local not specified - PPO | Admitting: Family Medicine

## 2021-11-22 ENCOUNTER — Encounter: Payer: Self-pay | Admitting: Family Medicine

## 2021-11-22 ENCOUNTER — Ambulatory Visit: Payer: Federal, State, Local not specified - PPO | Admitting: Family Medicine

## 2021-11-24 ENCOUNTER — Telehealth: Payer: Federal, State, Local not specified - PPO | Admitting: Family Medicine

## 2021-11-24 ENCOUNTER — Encounter: Payer: Self-pay | Admitting: Family Medicine

## 2021-11-24 VITALS — Ht 72.0 in | Wt 214.0 lb

## 2021-11-24 DIAGNOSIS — J40 Bronchitis, not specified as acute or chronic: Secondary | ICD-10-CM | POA: Diagnosis not present

## 2021-11-24 NOTE — Progress Notes (Signed)
?[facility] ?[facility] ?[address] ?[place] [address] ?Dept: [phone] ?Dept Fax: [phone] ? ?Virtual Video Visit ? ?I connected with [name] on 11/24/21 at 10:00 AM EDT by a video enabled telemedicine application and verified that I am speaking with the correct person using two identifiers. ? ?Location patient: Home ?Location provider: Clinic ?Persons participating in the virtual visit: Patient, Provider ? ?I discussed the limitations of evaluation and management by telemedicine and the availability of in person appointments. The patient expressed understanding and agreed to proceed. ? ?Chief Complaint  ?Patient presents with  ? Acute Visit  ?  C/o still having cough, and ST.  Need work note.    ? ?SUBJECTIVE: ? ?HPI: Scott Cummings is a 51 y.o. male who presents with ongoing respiratory symptoms. I had seen Scott Cummings on 3/6 with a 2-day history of sore throat. He had some nasal congestion, rhinorrhea, and a mild dry cough associated with this. He finds it difficult to use OTC meds, as they tend to make him nauseous. We reviewed routine home care for viral illness. He notes that by 3/8 his symptoms had settled into his chest. over the next several days, he had significant cough productive of phlegm. He has had some improvement in his cough over the past few days. he did use some Dayquil, though he gets some stomach upset with this. He has been out of work since this 3/8. He feels he could try back at work tomorrow. ? ?Patient Active Problem List  ? Diagnosis Date Noted  ? Impingement syndrome of right shoulder 03/21/2021  ? Dyschromia 02/04/2021  ? Insomnia 02/04/2021  ? IBS (irritable bowel syndrome) 04/03/2017  ? Patellofemoral syndrome 07/30/2015  ? Tension headache 06/02/2015  ? ?Past Surgical History:  ?Procedure Laterality Date  ? pinched nerve foot    ? Left  ? SHOULDER ARTHROSCOPY Left 08/2018  ? SHOULDER ARTHROSCOPY WITH SUBACROMIAL DECOMPRESSION  Right 05/2021  ? WISDOM TOOTH EXTRACTION    ? ?Family History  ?Problem Relation Age of Onset  ? Cancer Mother   ?     Breast  ? Migraines Mother   ?     Living  ? Healthy Father   ?     Living  ? Heart failure Maternal Grandmother   ? Healthy Daughter   ?     x1  ? Allergies Son   ?     x1  ? Stomach cancer Other   ?     Great uncle  ? Colon cancer Neg Hx   ? ?Social History  ? ?Tobacco Use  ? Smoking status: Former  ?  Types: Cigarettes  ?  Quit date: 93  ?  Years since quitting: 27.2  ? Smokeless tobacco: Never  ?Vaping Use  ? Vaping Use: Never used  ?Substance Use Topics  ? Alcohol use: Yes  ?  Alcohol/week: 0.0 standard drinks  ?  Comment: Very rare  ? Drug use: Not Currently  ? ? ?Current Outpatient Medications:  ?  Acetaminophen-Caffeine (EXCEDRIN TENSION HEADACHE) 500-65 MG TABS, Take by mouth., Disp: , Rfl:  ?  diazepam (VALIUM) 10 MG tablet, TAKE 1/2 TO 1 TABLET BY MOUTH EVERY NIGHT AT BEDTIME AS NEEDED FOR SLEEP, Disp: 20 tablet, Rfl: 1 ?  loperamide (IMODIUM) 2 MG capsule, Take by mouth daily., Disp: , Rfl:  ? ?Allergies  ?Allergen Reactions  ? Hydrocodone Nausea And Vomiting and Nausea Only  ? Oxycodone Nausea And Vomiting and  Nausea Only  ? Naproxen Nausea Only  ? ?ROS: See pertinent positives and negatives per HPI. ? ?OBSERVATIONS/OBJECTIVE: ? ?VITALS per patient if applicable: ?Today's Vitals  ? 11/24/21 0947  ?Weight: 214 lb (97.1 kg)  ?Height: 6' (1.829 m)  ? ?Body mass index is 29.02 kg/m?. ?  ? ?GENERAL: Alert and oriented. Appears well and in no acute distress. ? ?HEENT: Atraumatic. Eyes clear. No obvious abnormalities on inspection of external nose and ears. ? ?NECK: Normal movements of the head and neck. ? ?LUNGS: On inspection, no signs of respiratory distress. Breathing rate appears normal. No obvious gross SOB, gasping or wheezing, and no conversational dyspnea. ? ?CV: No obvious cyanosis. ? ?PSYCH/NEURO: Pleasant and cooperative. No obvious depression or anxiety. Speech and thought  processing grossly intact. ? ?ASSESSMENT AND PLAN: ? ?1. Bronchitis ?We discussed the natural history of viral bronchitis. I do think it appropriate that he took some time to recover. I will release him to return to work tomorrow. ?  ?I discussed the assessment and treatment plan with the patient. The patient was provided an opportunity to ask questions and all were answered. The patient agreed with the plan and demonstrated an understanding of the instructions. ?  ?The patient was advised to call back or seek an in-person evaluation if the symptoms worsen or if the condition fails to improve as anticipated. ? ?Return if symptoms worsen or fail to improve.  ? ?Loyola Mast, MD  ?

## 2021-11-25 ENCOUNTER — Encounter: Payer: Self-pay | Admitting: Family Medicine

## 2021-12-05 ENCOUNTER — Ambulatory Visit (INDEPENDENT_AMBULATORY_CARE_PROVIDER_SITE_OTHER): Payer: Federal, State, Local not specified - PPO | Admitting: Family Medicine

## 2021-12-05 ENCOUNTER — Encounter: Payer: Self-pay | Admitting: Family Medicine

## 2021-12-05 ENCOUNTER — Other Ambulatory Visit: Payer: Self-pay | Admitting: Family Medicine

## 2021-12-05 VITALS — BP 122/86 | HR 80 | Temp 97.1°F | Ht 72.0 in | Wt 218.2 lb

## 2021-12-05 DIAGNOSIS — E782 Mixed hyperlipidemia: Secondary | ICD-10-CM

## 2021-12-05 DIAGNOSIS — E785 Hyperlipidemia, unspecified: Secondary | ICD-10-CM | POA: Insufficient documentation

## 2021-12-05 DIAGNOSIS — R7303 Prediabetes: Secondary | ICD-10-CM | POA: Insufficient documentation

## 2021-12-05 DIAGNOSIS — Z0000 Encounter for general adult medical examination without abnormal findings: Secondary | ICD-10-CM

## 2021-12-05 DIAGNOSIS — Z131 Encounter for screening for diabetes mellitus: Secondary | ICD-10-CM | POA: Diagnosis not present

## 2021-12-05 DIAGNOSIS — F5104 Psychophysiologic insomnia: Secondary | ICD-10-CM

## 2021-12-05 LAB — LIPID PANEL
Cholesterol: 214 mg/dL — ABNORMAL HIGH (ref 0–200)
HDL: 44.4 mg/dL (ref >39.00)
LDL Cholesterol: 143 mg/dL — ABNORMAL HIGH (ref 0–99)
NonHDL: 169.86
Total CHOL/HDL Ratio: 5
Triglycerides: 133 mg/dL (ref 0.0–149.0)
VLDL: 26.6 mg/dL (ref 0.0–40.0)

## 2021-12-05 LAB — HEMOGLOBIN A1C: Hgb A1c MFr Bld: 6.1 % (ref 4.6–6.5)

## 2021-12-05 LAB — GLUCOSE, RANDOM: Glucose, Bld: 94 mg/dL (ref 70–99)

## 2021-12-05 NOTE — Progress Notes (Signed)
?[facility] ?[facility] ?[address] ?[place] [address] ?Dept: [phone] ?Dept Fax: [phone] ? ?Annual Physical Visit ? ?Subjective:  ? ? Patient ID: Scott Cummings, male    DOB: 1971/01/04, 51 y.o..   MRN: [id number] ? ?Chief Complaint  ?Patient presents with  ? Annual Exam  ?  CPE/labs.  No concerns.  Fasting today.    ? ? ?History of Present Illness: ? ?Patient is in today for an annual physical/preventative visit. ? ?Mr. Salone was recently treated for an acute bronchitis. This is improving. Temporary issues related ot this are resolving. ? ?Review of Systems  ?Constitutional:  Negative for chills, diaphoresis, fever, malaise/fatigue and weight loss.  ?HENT:  Positive for tinnitus. Negative for congestion, ear discharge, ear pain, hearing loss, sinus pain and sore throat.   ?     Chronic tinnitus. Stable.  ?Eyes:  Negative for blurred vision, pain, discharge and redness.  ?Respiratory:  Negative for cough, hemoptysis, sputum production, shortness of breath and wheezing.   ?Cardiovascular:  Positive for palpitations. Negative for chest pain, orthopnea and leg swelling.  ?     Single brief episode of tachycardia, lasting seconds, some weeks ago. This has not recurred. ?  ?Gastrointestinal:  Positive for diarrhea and heartburn. Negative for abdominal pain, constipation, nausea and vomiting.  ?     Tends to have heartburn if he eats too late in the evenings. Typically does not need treatment. ? ?IBS with diarrhea predominant. Stable.  ?Musculoskeletal:  Positive for joint pain. Negative for back pain, myalgias and neck pain.  ?     Multiple joint issues. Mr. Matsuo has been going to [facility] in [place]. He apparently is undergoing prolotherapy. He has not seen any improvements at this point.  ?Skin:  Negative for itching and rash.  ?Psychiatric/Behavioral:  Negative for depression. The patient is not nervous/anxious.   ? ?Past Medical History: ?Patient Active  Problem List  ? Diagnosis Date Noted  ? Hyperlipidemia 12/05/2021  ? Impingement syndrome of right shoulder 03/21/2021  ? Dyschromia 02/04/2021  ? Insomnia 02/04/2021  ? IBS (irritable bowel syndrome) 04/03/2017  ? Patellofemoral syndrome 07/30/2015  ? Tension headache 06/02/2015  ? ?Past Surgical History:  ?Procedure Laterality Date  ? pinched nerve foot    ? Left  ? SHOULDER ARTHROSCOPY Left 08/2018  ? SHOULDER ARTHROSCOPY WITH SUBACROMIAL DECOMPRESSION Right 05/2021  ? WISDOM TOOTH EXTRACTION    ? ?Family History  ?Problem Relation Age of Onset  ? Cancer Mother   ?     Breast  ? Migraines Mother   ?     Living  ? Healthy Father   ?     Living  ? Heart failure Maternal Grandmother   ? Healthy Daughter   ?     x1  ? Allergies Son   ?     x1  ? Stomach cancer Other   ?     Great uncle  ? Colon cancer Neg Hx   ? ?Outpatient Medications Prior to Visit  ?Medication Sig Dispense Refill  ? Acetaminophen-Caffeine (EXCEDRIN TENSION HEADACHE) 500-65 MG TABS Take by mouth.    ? diazepam (VALIUM) 10 MG tablet TAKE 1/2 TO 1 TABLET BY MOUTH EVERY NIGHT AT BEDTIME AS NEEDED FOR SLEEP 20 tablet 1  ? loperamide (IMODIUM) 2 MG capsule Take by mouth daily.    ? ?No facility-administered medications prior to visit.  ? ?Allergies  ?Allergen Reactions  ? Hydrocodone Nausea And Vomiting and  Nausea Only  ? Oxycodone Nausea And Vomiting and Nausea Only  ? Naproxen Nausea Only  ? ?Objective:  ? ?Today's Vitals  ? 12/05/21 7371  ?BP: 122/86  ?Pulse: 80  ?Temp: (!) 97.1 ?F (36.2 ?C)  ?TempSrc: Temporal  ?SpO2: 98%  ?Weight: 218 lb 3.2 oz (99 kg)  ?Height: 6' (1.829 m)  ? ?Body mass index is 29.59 kg/m?.  ? ?General: Well developed, well nourished. No acute distress. ?HEENT: Normocephalic, non-traumatic. PERRL, EOMI. Conjunctiva clear. External ears normal. Left  ? EAC obstructed with wax. Right EAC and TM is normal. Nose clear without congestion or rhinorrhea.  ? Mucous membranes moist. Oropharynx clear. Fair dentition. ?Neck: Supple. No  lymphadenopathy. No thyromegaly. ?Lungs: Clear to auscultation bilaterally. No wheezing, rales or rhonchi. ?CV: RRR without murmurs or rubs. Pulses 2+ bilaterally. ?Abdomen: Soft, non-tender. Bowel sounds positive, normal pitch and frequency. No  ? hepatosplenomegaly. No rebound or guarding. ?Extremities: Full ROM. No joint swelling or tenderness. No edema noted. ?Skin: Warm and dry. Pitting/ridging of several finger nails. Brown macules on the right shin. There is a single light brown macule on the left lower abdomen. No rashes. ?Psych: Alert and oriented. Normal mood and affect. ? ?Health Maintenance Due  ?Topic Date Due  ? HIV Screening  Never done  ?   ?Lab Results: ?Lab Results  ?Component Value Date  ? HGBA1C 5.7 10/04/2020  ? ?Lab Results  ?Component Value Date  ? CHOL 218 (H) 10/04/2020  ? HDL 47.80 10/04/2020  ? LDLCALC 132 (H) 10/04/2020  ? TRIG 192.0 (H) 10/04/2020  ? CHOLHDL 5 10/04/2020  ? ?Assessment & Plan:  ? ?1. Annual physical exam ?Overall good health. Reviewed recommended screenings. Immunizations are up to date. ? ?2. Screening for diabetes mellitus (DM) ?Prior A1c at lower limit of prediabetes range. ? ?- Glucose, random ?- Hemoglobin A1c ? ?3. Mixed hyperlipidemia ?Due to reassess lipids. ? ?- Lipid panel ? ? ?Return in about 1 year (around 12/06/2022) for Reassessment.  ? ?Loyola Mast, MD ?

## 2022-01-05 ENCOUNTER — Encounter: Payer: Self-pay | Admitting: Family Medicine

## 2022-01-05 ENCOUNTER — Ambulatory Visit: Payer: Federal, State, Local not specified - PPO | Admitting: Family Medicine

## 2022-01-05 VITALS — BP 124/82 | HR 84 | Temp 98.3°F | Ht 72.0 in | Wt 220.6 lb

## 2022-01-05 DIAGNOSIS — J301 Allergic rhinitis due to pollen: Secondary | ICD-10-CM

## 2022-01-05 MED ORDER — CETIRIZINE HCL 10 MG PO TABS: 10.0000 mg | ORAL_TABLET | Freq: Every day | ORAL | 11 refills | Status: AC

## 2022-01-05 MED ORDER — FLUTICASONE PROPIONATE 50 MCG/ACT NA SUSP: 1.0000 | Freq: Every day | NASAL | 6 refills | Status: DC

## 2022-01-05 NOTE — Patient Instructions (Signed)
Allergic Rhinitis, Adult  Allergic rhinitis is an allergic reaction that affects the mucous membrane inside the nose. The mucous membrane is the tissue that produces mucus. There are two types of allergic rhinitis: Seasonal. This type is also called hay fever and happens only during certain seasons. Perennial. This type can happen at any time of the year. Allergic rhinitis cannot be spread from person to person. This condition can be mild, moderate, or severe. It can develop at any age and may be outgrown. What are the causes? This condition is caused by allergens. These are things that can cause an allergic reaction. Allergens may differ for seasonal allergic rhinitis and perennial allergic rhinitis. Seasonal allergic rhinitis is triggered by pollen. Pollen can come from grasses, trees, and weeds. Perennial allergic rhinitis may be triggered by: Dust mites. Proteins in a pet's urine, saliva, or dander. Dander is dead skin cells from a pet. Smoke, mold, or car fumes. What increases the risk? You are more likely to develop this condition if you have a family history of allergies or other conditions related to allergies, including: Allergic conjunctivitis. This is inflammation of parts of the eyes and eyelids. Asthma. This condition affects the lungs and makes it hard to breathe. Atopic dermatitis or eczema. This is long term (chronic) inflammation of the skin. Food allergies. What are the signs or symptoms? Symptoms of this condition include: Sneezing or coughing. A stuffy nose (nasal congestion), itchy nose, or nasal discharge. Itchy eyes and tearing of the eyes. A feeling of mucus dripping down the back of your throat (postnasal drip). Trouble sleeping. Tiredness or fatigue. Headache. Sore throat. How is this diagnosed? This condition may be diagnosed with your symptoms, medical history, and physical exam. Your health care provider may check for related conditions, such  as: Asthma. Pink eye. This is eye inflammation caused by infection (conjunctivitis). Ear infection. Upper respiratory infection. This is an infection in the nose, throat, or upper airways. You may also have tests to find out which allergens trigger your symptoms. These may include skin tests or blood tests. How is this treated? There is no cure for this condition, but treatment can help control symptoms. Treatment may include: Taking medicines that block allergy symptoms, such as corticosteroids and antihistamines. Medicine may be given as a shot, nasal spray, or pill. Avoiding any allergens. Being exposed again and again to tiny amounts of allergens to help you build a defense against allergens (immunotherapy). This is done if other treatments have not helped. It may include: Allergy shots. These are injected medicines that have small amounts of allergen in them. Sublingual immunotherapy. This involves taking small doses of a medicine with allergen in it under your tongue. If these treatments do not work, your health care provider may prescribe newer, stronger medicines. Follow these instructions at home: Avoiding allergens Find out what you are allergic to and avoid those allergens. These are some things you can do to help avoid allergens: If you have perennial allergies: Replace carpet with wood, tile, or vinyl flooring. Carpet can trap dander and dust. Do not smoke. Do not allow smoking in your home. Change your heating and air conditioning filters at least once a month. If you have seasonal allergies, take these steps during allergy season: Keep windows closed as much as possible. Plan outdoor activities when pollen counts are lowest. Check pollen counts before you plan outdoor activities. When coming indoors, change clothing and shower before sitting on furniture or bedding. If you have a pet   in the house that produces allergens: Keep the pet out of the bedroom. Vacuum, sweep, and  dust regularly. General instructions Take over-the-counter and prescription medicines only as told by your health care provider. Drink enough fluid to keep your urine pale yellow. Keep all follow-up visits as told by your health care provider. This is important. Where to find more information American Academy of Allergy, Asthma & Immunology: www.aaaai.org Contact a health care provider if: You have a fever. You develop a cough that does not go away. You make whistling sounds when you breathe (wheeze). Your symptoms slow you down or stop you from doing your normal activities each day. Get help right away if: You have shortness of breath. This symptom may represent a serious problem that is an emergency. Do not wait to see if the symptom will go away. Get medical help right away. Call your local emergency services (911 in the [place] Do not drive yourself to the hospital. Summary Allergic rhinitis may be managed by taking medicines as directed and avoiding allergens. If you have seasonal allergies, keep windows closed as much as possible during allergy season. Contact your health care provider if you develop a fever or a cough that does not go away. This information is not intended to replace advice given to you by your health care provider. Make sure you discuss any questions you have with your health care provider. Document Revised: 10/17/2019 Document Reviewed: 08/26/2019 Elsevier Patient Education  2023 Elsevier Inc.  

## 2022-01-05 NOTE — Progress Notes (Signed)
?[facility] ?[facility] ?[address] ?[place] [address] ?Dept: [phone] ?Dept Fax: [phone] ? ?Office Visit ? ?Subjective:  ? ? Patient ID: Scott Cummings, male    DOB: 25-Jun-1971, 51 y.o..   MRN: [id number] ? ?Chief Complaint  ?Patient presents with  ? Acute Visit  ?  Co having ST, chest congestion/cough.  Tylenol taken.    ? ? ?History of Present Illness: ? ?Patient is in today for a 4-day history of sore throat, chest congestion, and cough. He admits to issues with sneezing and rhinorrhea. He notes the current cough does feel different than from the bronchitis he had in March. He does admit that some of his symptoms may be allergic in nature. He does not take any regular allergy meds, as he avoids medicines in general. ? ?Past Medical History: ?Patient Active Problem List  ? Diagnosis Date Noted  ? Seasonal allergic rhinitis due to pollen 01/05/2022  ? Hyperlipidemia 12/05/2021  ? Prediabetes 12/05/2021  ? Impingement syndrome of right shoulder 03/21/2021  ? Dyschromia 02/04/2021  ? Insomnia 02/04/2021  ? IBS (irritable bowel syndrome) 04/03/2017  ? Patellofemoral syndrome 07/30/2015  ? Tension headache 06/02/2015  ? ?Past Surgical History:  ?Procedure Laterality Date  ? pinched nerve foot    ? Left  ? SHOULDER ARTHROSCOPY Left 08/2018  ? SHOULDER ARTHROSCOPY WITH SUBACROMIAL DECOMPRESSION Right 05/2021  ? WISDOM TOOTH EXTRACTION    ? ?Family History  ?Problem Relation Age of Onset  ? Cancer Mother   ?     Breast  ? Migraines Mother   ?     Living  ? Healthy Father   ?     Living  ? Heart failure Maternal Grandmother   ? Healthy Daughter   ?     x1  ? Allergies Son   ?     x1  ? Stomach cancer Other   ?     Great uncle  ? Colon cancer Neg Hx   ? ?Outpatient Medications Prior to Visit  ?Medication Sig Dispense Refill  ? Acetaminophen-Caffeine (EXCEDRIN TENSION HEADACHE) 500-65 MG TABS Take by mouth.    ? diazepam (VALIUM) 10 MG tablet TAKE 1/2 TO 1 TABLET BY  MOUTH EVERY NIGHT AT BEDTIME AS NEEDED FOR SLEEP 20 tablet 1  ? loperamide (IMODIUM) 2 MG capsule Take by mouth daily.    ? ?No facility-administered medications prior to visit.  ? ?Allergies  ?Allergen Reactions  ? Hydrocodone Nausea And Vomiting and Nausea Only  ? Oxycodone Nausea And Vomiting and Nausea Only  ? Naproxen Nausea Only  ?   ?Objective:  ? ?Today's Vitals  ? 01/05/22 1105  ?BP: 124/82  ?Pulse: 84  ?Temp: 98.3 ?F (36.8 ?C)  ?TempSrc: Temporal  ?SpO2: 96%  ?Weight: 220 lb 9.6 oz (100.1 kg)  ?Height: 6' (1.829 m)  ? ?Body mass index is 29.92 kg/m?.  ? ?General: Well developed, well nourished. No acute distress. ?HEENT: Normocephalic, non-traumatic. Conjunctiva clear. External ears normal. EAC and TMs  ? normal bilaterally. Nose  with moderate congestion, pale, swollen mucosa and clear rhinorrhea.  ? Mucous membranes moist. Mucous streaking of the posterior oropharynx clear. Good dentition. ?Neck: Supple. No lymphadenopathy. No thyromegaly. ?Lungs: Clear to auscultation bilaterally. No wheezing, rales or rhonchi. ?Psych: Alert and oriented. Normal mood and affect. ? ?Health Maintenance Due  ?Topic Date Due  ? HIV Screening  Never done  ?   ?Assessment & Plan:  ? ?1. Seasonal allergic rhinitis due  to pollen ?Mr. Nissley's symptoms and exams are consistent with allergic rhinitis. I recommend he start taking a daily antihistamine and use a steroid nasal spray. We discussed technique for use of the nasal spray. He should follow up if not improving or if his symptoms worsen over the next week. ? ?- fluticasone (FLONASE) 50 MCG/ACT nasal spray; Place 1 spray into both nostrils daily.  Dispense: 16 g; Refill: 6 ?- cetirizine (ZYRTEC) 10 MG tablet; Take 1 tablet (10 mg total) by mouth daily.  Dispense: 30 tablet; Refill: 11 ? ?Return if symptoms worsen or fail to improve.  ? ?Loyola Mast, MD ?

## 2022-02-16 ENCOUNTER — Other Ambulatory Visit: Payer: Self-pay | Admitting: Family Medicine

## 2022-02-16 DIAGNOSIS — F5104 Psychophysiologic insomnia: Secondary | ICD-10-CM

## 2022-02-16 NOTE — Telephone Encounter (Signed)
Refill request for  Diazepam 10 mg LR 12/05/21, #20, 1 rf LOV 01/05/22 FOV  none scheduled.  Please review and advise.  Thanks. Dm/cma

## 2022-02-17 ENCOUNTER — Other Ambulatory Visit: Payer: Self-pay | Admitting: Family Medicine

## 2022-02-17 DIAGNOSIS — F5104 Psychophysiologic insomnia: Secondary | ICD-10-CM

## 2022-03-15 ENCOUNTER — Other Ambulatory Visit: Payer: Self-pay | Admitting: Family Medicine

## 2022-03-15 DIAGNOSIS — F5104 Psychophysiologic insomnia: Secondary | ICD-10-CM

## 2022-03-16 MED ORDER — DIAZEPAM 10 MG PO TABS: ORAL_TABLET | ORAL | 1 refills | Status: DC

## 2022-05-24 ENCOUNTER — Other Ambulatory Visit: Payer: Self-pay | Admitting: Family Medicine

## 2022-05-24 DIAGNOSIS — M545 Low back pain, unspecified: Secondary | ICD-10-CM | POA: Diagnosis not present

## 2022-05-24 DIAGNOSIS — F5104 Psychophysiologic insomnia: Secondary | ICD-10-CM

## 2022-05-25 DIAGNOSIS — M9903 Segmental and somatic dysfunction of lumbar region: Secondary | ICD-10-CM | POA: Diagnosis not present

## 2022-05-25 DIAGNOSIS — M9905 Segmental and somatic dysfunction of pelvic region: Secondary | ICD-10-CM | POA: Diagnosis not present

## 2022-05-25 DIAGNOSIS — M9902 Segmental and somatic dysfunction of thoracic region: Secondary | ICD-10-CM | POA: Diagnosis not present

## 2022-05-25 DIAGNOSIS — M9901 Segmental and somatic dysfunction of cervical region: Secondary | ICD-10-CM | POA: Diagnosis not present

## 2022-05-31 DIAGNOSIS — M9903 Segmental and somatic dysfunction of lumbar region: Secondary | ICD-10-CM | POA: Diagnosis not present

## 2022-05-31 DIAGNOSIS — M9901 Segmental and somatic dysfunction of cervical region: Secondary | ICD-10-CM | POA: Diagnosis not present

## 2022-05-31 DIAGNOSIS — M9905 Segmental and somatic dysfunction of pelvic region: Secondary | ICD-10-CM | POA: Diagnosis not present

## 2022-05-31 DIAGNOSIS — M9902 Segmental and somatic dysfunction of thoracic region: Secondary | ICD-10-CM | POA: Diagnosis not present

## 2022-06-01 DIAGNOSIS — M9905 Segmental and somatic dysfunction of pelvic region: Secondary | ICD-10-CM | POA: Diagnosis not present

## 2022-06-01 DIAGNOSIS — M9901 Segmental and somatic dysfunction of cervical region: Secondary | ICD-10-CM | POA: Diagnosis not present

## 2022-06-01 DIAGNOSIS — M9902 Segmental and somatic dysfunction of thoracic region: Secondary | ICD-10-CM | POA: Diagnosis not present

## 2022-06-01 DIAGNOSIS — M9903 Segmental and somatic dysfunction of lumbar region: Secondary | ICD-10-CM | POA: Diagnosis not present

## 2022-06-05 DIAGNOSIS — M9901 Segmental and somatic dysfunction of cervical region: Secondary | ICD-10-CM | POA: Diagnosis not present

## 2022-06-05 DIAGNOSIS — M9903 Segmental and somatic dysfunction of lumbar region: Secondary | ICD-10-CM | POA: Diagnosis not present

## 2022-06-05 DIAGNOSIS — M9902 Segmental and somatic dysfunction of thoracic region: Secondary | ICD-10-CM | POA: Diagnosis not present

## 2022-06-05 DIAGNOSIS — M9905 Segmental and somatic dysfunction of pelvic region: Secondary | ICD-10-CM | POA: Diagnosis not present

## 2022-06-07 DIAGNOSIS — M9902 Segmental and somatic dysfunction of thoracic region: Secondary | ICD-10-CM | POA: Diagnosis not present

## 2022-06-07 DIAGNOSIS — M9905 Segmental and somatic dysfunction of pelvic region: Secondary | ICD-10-CM | POA: Diagnosis not present

## 2022-06-07 DIAGNOSIS — M9901 Segmental and somatic dysfunction of cervical region: Secondary | ICD-10-CM | POA: Diagnosis not present

## 2022-06-07 DIAGNOSIS — M9903 Segmental and somatic dysfunction of lumbar region: Secondary | ICD-10-CM | POA: Diagnosis not present

## 2022-06-13 DIAGNOSIS — M9903 Segmental and somatic dysfunction of lumbar region: Secondary | ICD-10-CM | POA: Diagnosis not present

## 2022-06-13 DIAGNOSIS — M9905 Segmental and somatic dysfunction of pelvic region: Secondary | ICD-10-CM | POA: Diagnosis not present

## 2022-06-13 DIAGNOSIS — M9902 Segmental and somatic dysfunction of thoracic region: Secondary | ICD-10-CM | POA: Diagnosis not present

## 2022-06-13 DIAGNOSIS — M9901 Segmental and somatic dysfunction of cervical region: Secondary | ICD-10-CM | POA: Diagnosis not present

## 2022-06-15 DIAGNOSIS — M9901 Segmental and somatic dysfunction of cervical region: Secondary | ICD-10-CM | POA: Diagnosis not present

## 2022-06-15 DIAGNOSIS — M9903 Segmental and somatic dysfunction of lumbar region: Secondary | ICD-10-CM | POA: Diagnosis not present

## 2022-06-15 DIAGNOSIS — M9902 Segmental and somatic dysfunction of thoracic region: Secondary | ICD-10-CM | POA: Diagnosis not present

## 2022-06-15 DIAGNOSIS — M9905 Segmental and somatic dysfunction of pelvic region: Secondary | ICD-10-CM | POA: Diagnosis not present

## 2022-06-22 DIAGNOSIS — M9901 Segmental and somatic dysfunction of cervical region: Secondary | ICD-10-CM | POA: Diagnosis not present

## 2022-06-22 DIAGNOSIS — M9903 Segmental and somatic dysfunction of lumbar region: Secondary | ICD-10-CM | POA: Diagnosis not present

## 2022-06-22 DIAGNOSIS — M9902 Segmental and somatic dysfunction of thoracic region: Secondary | ICD-10-CM | POA: Diagnosis not present

## 2022-06-22 DIAGNOSIS — M9905 Segmental and somatic dysfunction of pelvic region: Secondary | ICD-10-CM | POA: Diagnosis not present

## 2022-06-26 DIAGNOSIS — M9901 Segmental and somatic dysfunction of cervical region: Secondary | ICD-10-CM | POA: Diagnosis not present

## 2022-06-26 DIAGNOSIS — M9905 Segmental and somatic dysfunction of pelvic region: Secondary | ICD-10-CM | POA: Diagnosis not present

## 2022-06-26 DIAGNOSIS — M9903 Segmental and somatic dysfunction of lumbar region: Secondary | ICD-10-CM | POA: Diagnosis not present

## 2022-06-26 DIAGNOSIS — M9902 Segmental and somatic dysfunction of thoracic region: Secondary | ICD-10-CM | POA: Diagnosis not present

## 2022-06-27 DIAGNOSIS — M9905 Segmental and somatic dysfunction of pelvic region: Secondary | ICD-10-CM | POA: Diagnosis not present

## 2022-06-27 DIAGNOSIS — M9902 Segmental and somatic dysfunction of thoracic region: Secondary | ICD-10-CM | POA: Diagnosis not present

## 2022-06-27 DIAGNOSIS — M9903 Segmental and somatic dysfunction of lumbar region: Secondary | ICD-10-CM | POA: Diagnosis not present

## 2022-06-27 DIAGNOSIS — M9901 Segmental and somatic dysfunction of cervical region: Secondary | ICD-10-CM | POA: Diagnosis not present

## 2022-06-29 DIAGNOSIS — M9902 Segmental and somatic dysfunction of thoracic region: Secondary | ICD-10-CM | POA: Diagnosis not present

## 2022-06-29 DIAGNOSIS — M9901 Segmental and somatic dysfunction of cervical region: Secondary | ICD-10-CM | POA: Diagnosis not present

## 2022-06-29 DIAGNOSIS — M9905 Segmental and somatic dysfunction of pelvic region: Secondary | ICD-10-CM | POA: Diagnosis not present

## 2022-06-29 DIAGNOSIS — M9903 Segmental and somatic dysfunction of lumbar region: Secondary | ICD-10-CM | POA: Diagnosis not present

## 2022-07-03 DIAGNOSIS — M9903 Segmental and somatic dysfunction of lumbar region: Secondary | ICD-10-CM | POA: Diagnosis not present

## 2022-07-03 DIAGNOSIS — M9901 Segmental and somatic dysfunction of cervical region: Secondary | ICD-10-CM | POA: Diagnosis not present

## 2022-07-03 DIAGNOSIS — M9902 Segmental and somatic dysfunction of thoracic region: Secondary | ICD-10-CM | POA: Diagnosis not present

## 2022-07-03 DIAGNOSIS — M9905 Segmental and somatic dysfunction of pelvic region: Secondary | ICD-10-CM | POA: Diagnosis not present

## 2022-07-06 DIAGNOSIS — M9905 Segmental and somatic dysfunction of pelvic region: Secondary | ICD-10-CM | POA: Diagnosis not present

## 2022-07-06 DIAGNOSIS — M9902 Segmental and somatic dysfunction of thoracic region: Secondary | ICD-10-CM | POA: Diagnosis not present

## 2022-07-06 DIAGNOSIS — M9903 Segmental and somatic dysfunction of lumbar region: Secondary | ICD-10-CM | POA: Diagnosis not present

## 2022-07-06 DIAGNOSIS — M9901 Segmental and somatic dysfunction of cervical region: Secondary | ICD-10-CM | POA: Diagnosis not present

## 2022-07-17 DIAGNOSIS — M9902 Segmental and somatic dysfunction of thoracic region: Secondary | ICD-10-CM | POA: Diagnosis not present

## 2022-07-17 DIAGNOSIS — M9901 Segmental and somatic dysfunction of cervical region: Secondary | ICD-10-CM | POA: Diagnosis not present

## 2022-07-17 DIAGNOSIS — M9903 Segmental and somatic dysfunction of lumbar region: Secondary | ICD-10-CM | POA: Diagnosis not present

## 2022-07-17 DIAGNOSIS — M9905 Segmental and somatic dysfunction of pelvic region: Secondary | ICD-10-CM | POA: Diagnosis not present

## 2022-07-20 DIAGNOSIS — M9902 Segmental and somatic dysfunction of thoracic region: Secondary | ICD-10-CM | POA: Diagnosis not present

## 2022-07-20 DIAGNOSIS — M9903 Segmental and somatic dysfunction of lumbar region: Secondary | ICD-10-CM | POA: Diagnosis not present

## 2022-07-20 DIAGNOSIS — M9901 Segmental and somatic dysfunction of cervical region: Secondary | ICD-10-CM | POA: Diagnosis not present

## 2022-07-20 DIAGNOSIS — M9905 Segmental and somatic dysfunction of pelvic region: Secondary | ICD-10-CM | POA: Diagnosis not present

## 2022-07-24 DIAGNOSIS — M9902 Segmental and somatic dysfunction of thoracic region: Secondary | ICD-10-CM | POA: Diagnosis not present

## 2022-07-24 DIAGNOSIS — M9903 Segmental and somatic dysfunction of lumbar region: Secondary | ICD-10-CM | POA: Diagnosis not present

## 2022-07-24 DIAGNOSIS — M9905 Segmental and somatic dysfunction of pelvic region: Secondary | ICD-10-CM | POA: Diagnosis not present

## 2022-07-24 DIAGNOSIS — M9901 Segmental and somatic dysfunction of cervical region: Secondary | ICD-10-CM | POA: Diagnosis not present

## 2022-07-27 DIAGNOSIS — M9905 Segmental and somatic dysfunction of pelvic region: Secondary | ICD-10-CM | POA: Diagnosis not present

## 2022-07-27 DIAGNOSIS — M9901 Segmental and somatic dysfunction of cervical region: Secondary | ICD-10-CM | POA: Diagnosis not present

## 2022-07-27 DIAGNOSIS — M9903 Segmental and somatic dysfunction of lumbar region: Secondary | ICD-10-CM | POA: Diagnosis not present

## 2022-07-27 DIAGNOSIS — M9902 Segmental and somatic dysfunction of thoracic region: Secondary | ICD-10-CM | POA: Diagnosis not present

## 2022-08-02 DIAGNOSIS — M9902 Segmental and somatic dysfunction of thoracic region: Secondary | ICD-10-CM | POA: Diagnosis not present

## 2022-08-02 DIAGNOSIS — M9901 Segmental and somatic dysfunction of cervical region: Secondary | ICD-10-CM | POA: Diagnosis not present

## 2022-08-02 DIAGNOSIS — M9903 Segmental and somatic dysfunction of lumbar region: Secondary | ICD-10-CM | POA: Diagnosis not present

## 2022-08-02 DIAGNOSIS — M9905 Segmental and somatic dysfunction of pelvic region: Secondary | ICD-10-CM | POA: Diagnosis not present

## 2022-08-08 DIAGNOSIS — M9902 Segmental and somatic dysfunction of thoracic region: Secondary | ICD-10-CM | POA: Diagnosis not present

## 2022-08-08 DIAGNOSIS — M9905 Segmental and somatic dysfunction of pelvic region: Secondary | ICD-10-CM | POA: Diagnosis not present

## 2022-08-08 DIAGNOSIS — M9903 Segmental and somatic dysfunction of lumbar region: Secondary | ICD-10-CM | POA: Diagnosis not present

## 2022-08-08 DIAGNOSIS — M9901 Segmental and somatic dysfunction of cervical region: Secondary | ICD-10-CM | POA: Diagnosis not present

## 2022-08-10 DIAGNOSIS — M9902 Segmental and somatic dysfunction of thoracic region: Secondary | ICD-10-CM | POA: Diagnosis not present

## 2022-08-10 DIAGNOSIS — M9903 Segmental and somatic dysfunction of lumbar region: Secondary | ICD-10-CM | POA: Diagnosis not present

## 2022-08-10 DIAGNOSIS — M9905 Segmental and somatic dysfunction of pelvic region: Secondary | ICD-10-CM | POA: Diagnosis not present

## 2022-08-10 DIAGNOSIS — M9901 Segmental and somatic dysfunction of cervical region: Secondary | ICD-10-CM | POA: Diagnosis not present

## 2022-08-14 DIAGNOSIS — M9902 Segmental and somatic dysfunction of thoracic region: Secondary | ICD-10-CM | POA: Diagnosis not present

## 2022-08-14 DIAGNOSIS — M9905 Segmental and somatic dysfunction of pelvic region: Secondary | ICD-10-CM | POA: Diagnosis not present

## 2022-08-14 DIAGNOSIS — M9901 Segmental and somatic dysfunction of cervical region: Secondary | ICD-10-CM | POA: Diagnosis not present

## 2022-08-14 DIAGNOSIS — M9903 Segmental and somatic dysfunction of lumbar region: Secondary | ICD-10-CM | POA: Diagnosis not present

## 2022-08-15 ENCOUNTER — Other Ambulatory Visit: Payer: Self-pay | Admitting: Family Medicine

## 2022-08-15 DIAGNOSIS — F5104 Psychophysiologic insomnia: Secondary | ICD-10-CM

## 2022-08-16 NOTE — Telephone Encounter (Signed)
Left VM to rtn call to schedule an appt.  Dm/cma

## 2022-08-17 DIAGNOSIS — M9902 Segmental and somatic dysfunction of thoracic region: Secondary | ICD-10-CM | POA: Diagnosis not present

## 2022-08-17 DIAGNOSIS — M9903 Segmental and somatic dysfunction of lumbar region: Secondary | ICD-10-CM | POA: Diagnosis not present

## 2022-08-17 DIAGNOSIS — M9901 Segmental and somatic dysfunction of cervical region: Secondary | ICD-10-CM | POA: Diagnosis not present

## 2022-08-17 DIAGNOSIS — M9905 Segmental and somatic dysfunction of pelvic region: Secondary | ICD-10-CM | POA: Diagnosis not present

## 2022-08-22 NOTE — Telephone Encounter (Signed)
Left VM to rtn call to schedule an appt.  Dm/cma  

## 2022-08-24 DIAGNOSIS — M9902 Segmental and somatic dysfunction of thoracic region: Secondary | ICD-10-CM | POA: Diagnosis not present

## 2022-08-24 DIAGNOSIS — M9903 Segmental and somatic dysfunction of lumbar region: Secondary | ICD-10-CM | POA: Diagnosis not present

## 2022-08-24 DIAGNOSIS — M9905 Segmental and somatic dysfunction of pelvic region: Secondary | ICD-10-CM | POA: Diagnosis not present

## 2022-08-24 DIAGNOSIS — M9901 Segmental and somatic dysfunction of cervical region: Secondary | ICD-10-CM | POA: Diagnosis not present

## 2022-08-24 NOTE — Telephone Encounter (Signed)
Left VM to rtn call to schedule an appt.  Dm/cma  

## 2022-08-25 NOTE — Telephone Encounter (Signed)
Appointment scheduled 09/08/22.  Dm/cma

## 2022-09-08 ENCOUNTER — Ambulatory Visit: Payer: Federal, State, Local not specified - PPO | Admitting: Family Medicine

## 2022-09-08 ENCOUNTER — Encounter: Payer: Self-pay | Admitting: Family Medicine

## 2022-09-08 VITALS — BP 118/74 | HR 78 | Temp 97.2°F | Ht 72.0 in | Wt 207.4 lb

## 2022-09-08 DIAGNOSIS — G2581 Restless legs syndrome: Secondary | ICD-10-CM | POA: Diagnosis not present

## 2022-09-08 DIAGNOSIS — F5104 Psychophysiologic insomnia: Secondary | ICD-10-CM

## 2022-09-08 MED ORDER — DIAZEPAM 10 MG PO TABS: 5.0000 mg | ORAL_TABLET | Freq: Every evening | ORAL | 1 refills | Status: DC | PRN

## 2022-09-08 MED ORDER — ROPINIROLE HCL 0.25 MG PO TABS: 0.2500 mg | ORAL_TABLET | Freq: Every day | ORAL | 2 refills | Status: DC

## 2022-09-08 NOTE — Progress Notes (Signed)
[facility] [address] [place] [address] Dept: [phone] Dept Fax: [phone]  Chronic Care Office Visit  Subjective:    Patient ID: Scott Cummings, male    DOB: 02-03-1971, 51 y.o..   MRN: [id number]  Chief Complaint  Patient presents with   Follow-up    F/u meds. No concerns.     History of Present Illness:  Patient is in today for reassessment of chronic medical issues.  Scott Cummings has a history of chronic insomnia. He notes that he does try to go to sleep on his own. He finds that if he waits too late in the evening, rather than feeling sleepy, this seems to make him feel too alert to sleep. He takes 1/2 of a 10 mg diazepam if he can't sleep and sometimes takes the other half. He does this several nights a week. Since the birth of his son, his sleep has been more often disturbed. He does admit to feels of agitation or restlessness, esp. involving his legs. This worsens the insomnia.  Past Medical History: Patient Active Problem List   Diagnosis Date Noted   Seasonal allergic rhinitis due to pollen 01/05/2022   Hyperlipidemia 12/05/2021   Prediabetes 12/05/2021   Impingement syndrome of right shoulder 03/21/2021   Dyschromia 02/04/2021   Insomnia 02/04/2021   IBS (irritable bowel syndrome) 04/03/2017   Patellofemoral syndrome 07/30/2015   Tension headache 06/02/2015   Past Surgical History:  Procedure Laterality Date   pinched nerve foot     Left   SHOULDER ARTHROSCOPY Left 08/2018   SHOULDER ARTHROSCOPY WITH SUBACROMIAL DECOMPRESSION Right 05/2021   WISDOM TOOTH EXTRACTION     Family History  Problem Relation Age of Onset   Cancer Mother        Breast   Migraines Mother        Living   Healthy Father        Living   Heart failure Maternal Grandmother    Healthy Daughter        x1   Allergies Son        x1   Stomach cancer Other        Great uncle   Colon cancer Neg Hx    Outpatient  Medications Prior to Visit  Medication Sig Dispense Refill   Acetaminophen-Caffeine (EXCEDRIN TENSION HEADACHE) 500-65 MG TABS Take by mouth.     diazepam (VALIUM) 10 MG tablet TAKE 1/2 TO 1 TABLET BY MOUTH AT BEDTIME AS NEEDED FOR SLEEP 20 tablet 1   fluticasone (FLONASE) 50 MCG/ACT nasal spray Place 1 spray into both nostrils daily. 16 g 6   cetirizine (ZYRTEC) 10 MG tablet Take 1 tablet (10 mg total) by mouth daily. (Patient not taking: Reported on 09/08/2022) 30 tablet 11   loperamide (IMODIUM) 2 MG capsule Take by mouth daily.     No facility-administered medications prior to visit.   Allergies  Allergen Reactions   Hydrocodone Nausea And Vomiting and Nausea Only   Oxycodone Nausea And Vomiting and Nausea Only   Naproxen Nausea Only     Objective:   Today's Vitals   09/08/22 0922  BP: 118/74  Pulse: 78  Temp: (!) 97.2 F (36.2 C)  TempSrc: Temporal  SpO2: 96%  Weight: 207 lb 6.4 oz (94.1 kg)  Height: 6' (1.829 m)   Body mass index is 28.13 kg/m.   General: Well developed, well nourished. No acute distress. Psych: Alert and oriented. Normal mood and affect.  Health Maintenance Due  Topic Date Due   HIV Screening  Never done     Assessment & Plan:   1. Psychophysiological insomnia We did discuss aspects of good sleep hygiene for him to try and improve his sleep. I will continue his diazepam for the current time.  - diazepam (VALIUM) 10 MG tablet; Take 0.5-1 tablets (5-10 mg total) by mouth at bedtime as needed. for sleep  Dispense: 20 tablet; Refill: 1  2. Restless legs I am suspicious for there being a component of restless legs syndrome involved in the insomnia issues. I will check iron levels. I will give a trial of ropinorole, hoping this will reduce the restlessness.  - Iron, TIBC and Ferritin Panel - rOPINIRole (REQUIP) 0.25 MG tablet; Take 1 tablet (0.25 mg total) by mouth at bedtime. May increase to 2 tablets after 7 days if symptoms are not improved.   Dispense: 60 tablet; Refill: 2   Return in about 6 months (around 03/10/2023) for Reassessment.   Loyola Mast, MD

## 2022-09-09 LAB — IRON,TIBC AND FERRITIN PANEL
%SAT: 51 % (calc) — ABNORMAL HIGH (ref 20–48)
Ferritin: 210 ng/mL (ref 38–380)
Iron: 152 ug/dL (ref 50–180)
TIBC: 300 mcg/dL (calc) (ref 250–425)

## 2022-10-09 ENCOUNTER — Ambulatory Visit: Payer: Federal, State, Local not specified - PPO | Admitting: Family Medicine

## 2022-10-09 DIAGNOSIS — M9902 Segmental and somatic dysfunction of thoracic region: Secondary | ICD-10-CM | POA: Diagnosis not present

## 2022-10-09 DIAGNOSIS — M9903 Segmental and somatic dysfunction of lumbar region: Secondary | ICD-10-CM | POA: Diagnosis not present

## 2022-10-09 DIAGNOSIS — M9901 Segmental and somatic dysfunction of cervical region: Secondary | ICD-10-CM | POA: Diagnosis not present

## 2022-10-09 DIAGNOSIS — M9905 Segmental and somatic dysfunction of pelvic region: Secondary | ICD-10-CM | POA: Diagnosis not present

## 2022-10-11 DIAGNOSIS — M9901 Segmental and somatic dysfunction of cervical region: Secondary | ICD-10-CM | POA: Diagnosis not present

## 2022-10-11 DIAGNOSIS — M9905 Segmental and somatic dysfunction of pelvic region: Secondary | ICD-10-CM | POA: Diagnosis not present

## 2022-10-11 DIAGNOSIS — M9902 Segmental and somatic dysfunction of thoracic region: Secondary | ICD-10-CM | POA: Diagnosis not present

## 2022-10-11 DIAGNOSIS — M9903 Segmental and somatic dysfunction of lumbar region: Secondary | ICD-10-CM | POA: Diagnosis not present

## 2022-10-13 DIAGNOSIS — M9905 Segmental and somatic dysfunction of pelvic region: Secondary | ICD-10-CM | POA: Diagnosis not present

## 2022-10-13 DIAGNOSIS — M9901 Segmental and somatic dysfunction of cervical region: Secondary | ICD-10-CM | POA: Diagnosis not present

## 2022-10-13 DIAGNOSIS — M9903 Segmental and somatic dysfunction of lumbar region: Secondary | ICD-10-CM | POA: Diagnosis not present

## 2022-10-13 DIAGNOSIS — M9902 Segmental and somatic dysfunction of thoracic region: Secondary | ICD-10-CM | POA: Diagnosis not present

## 2022-10-17 DIAGNOSIS — M9903 Segmental and somatic dysfunction of lumbar region: Secondary | ICD-10-CM | POA: Diagnosis not present

## 2022-10-17 DIAGNOSIS — M9902 Segmental and somatic dysfunction of thoracic region: Secondary | ICD-10-CM | POA: Diagnosis not present

## 2022-10-17 DIAGNOSIS — M9905 Segmental and somatic dysfunction of pelvic region: Secondary | ICD-10-CM | POA: Diagnosis not present

## 2022-10-17 DIAGNOSIS — M9901 Segmental and somatic dysfunction of cervical region: Secondary | ICD-10-CM | POA: Diagnosis not present

## 2022-10-24 DIAGNOSIS — M9902 Segmental and somatic dysfunction of thoracic region: Secondary | ICD-10-CM | POA: Diagnosis not present

## 2022-10-24 DIAGNOSIS — M9901 Segmental and somatic dysfunction of cervical region: Secondary | ICD-10-CM | POA: Diagnosis not present

## 2022-10-24 DIAGNOSIS — M9903 Segmental and somatic dysfunction of lumbar region: Secondary | ICD-10-CM | POA: Diagnosis not present

## 2022-10-24 DIAGNOSIS — M9905 Segmental and somatic dysfunction of pelvic region: Secondary | ICD-10-CM | POA: Diagnosis not present

## 2022-10-31 DIAGNOSIS — M9905 Segmental and somatic dysfunction of pelvic region: Secondary | ICD-10-CM | POA: Diagnosis not present

## 2022-10-31 DIAGNOSIS — M9902 Segmental and somatic dysfunction of thoracic region: Secondary | ICD-10-CM | POA: Diagnosis not present

## 2022-10-31 DIAGNOSIS — M9901 Segmental and somatic dysfunction of cervical region: Secondary | ICD-10-CM | POA: Diagnosis not present

## 2022-10-31 DIAGNOSIS — M9903 Segmental and somatic dysfunction of lumbar region: Secondary | ICD-10-CM | POA: Diagnosis not present

## 2022-12-05 DIAGNOSIS — M25561 Pain in right knee: Secondary | ICD-10-CM | POA: Diagnosis not present

## 2022-12-06 DIAGNOSIS — M25561 Pain in right knee: Secondary | ICD-10-CM | POA: Diagnosis not present

## 2022-12-11 ENCOUNTER — Other Ambulatory Visit: Payer: Self-pay | Admitting: Family Medicine

## 2022-12-11 DIAGNOSIS — F5104 Psychophysiologic insomnia: Secondary | ICD-10-CM

## 2022-12-15 DIAGNOSIS — M25529 Pain in unspecified elbow: Secondary | ICD-10-CM | POA: Diagnosis not present

## 2022-12-15 DIAGNOSIS — R768 Other specified abnormal immunological findings in serum: Secondary | ICD-10-CM | POA: Diagnosis not present

## 2022-12-15 DIAGNOSIS — M25569 Pain in unspecified knee: Secondary | ICD-10-CM | POA: Diagnosis not present

## 2022-12-15 DIAGNOSIS — M2550 Pain in unspecified joint: Secondary | ICD-10-CM | POA: Diagnosis not present

## 2022-12-18 DIAGNOSIS — M549 Dorsalgia, unspecified: Secondary | ICD-10-CM | POA: Diagnosis not present

## 2022-12-18 DIAGNOSIS — M79672 Pain in left foot: Secondary | ICD-10-CM | POA: Diagnosis not present

## 2022-12-18 DIAGNOSIS — M533 Sacrococcygeal disorders, not elsewhere classified: Secondary | ICD-10-CM | POA: Diagnosis not present

## 2022-12-18 DIAGNOSIS — M79642 Pain in left hand: Secondary | ICD-10-CM | POA: Diagnosis not present

## 2022-12-18 DIAGNOSIS — M79641 Pain in right hand: Secondary | ICD-10-CM | POA: Diagnosis not present

## 2022-12-18 DIAGNOSIS — M545 Low back pain, unspecified: Secondary | ICD-10-CM | POA: Diagnosis not present

## 2022-12-18 DIAGNOSIS — M79671 Pain in right foot: Secondary | ICD-10-CM | POA: Diagnosis not present

## 2023-06-08 ENCOUNTER — Other Ambulatory Visit: Payer: Self-pay | Admitting: Family Medicine

## 2023-06-08 DIAGNOSIS — F5104 Psychophysiologic insomnia: Secondary | ICD-10-CM

## 2023-06-08 NOTE — Telephone Encounter (Signed)
Refill request for  Diazepam 10 mg LR 12/11/22, #20, 2 rf LOV  09/08/22 FOV  none scheduled.   Please review and advise.  Thanks. Dm/cma

## 2024-01-13 ENCOUNTER — Other Ambulatory Visit: Payer: Self-pay | Admitting: Family Medicine

## 2024-01-13 DIAGNOSIS — F5104 Psychophysiologic insomnia: Secondary | ICD-10-CM

## 2024-02-17 ENCOUNTER — Other Ambulatory Visit: Payer: Self-pay | Admitting: Family

## 2024-02-17 DIAGNOSIS — F5104 Psychophysiologic insomnia: Secondary | ICD-10-CM

## 2024-06-09 ENCOUNTER — Other Ambulatory Visit: Payer: Self-pay

## 2024-06-09 ENCOUNTER — Ambulatory Visit
Admission: RE | Admit: 2024-06-09 | Discharge: 2024-06-09 | Disposition: A | Discharge status: Home / Self Care | Source: Ambulatory Visit | Attending: Family Medicine | Admitting: Family Medicine

## 2024-06-09 ENCOUNTER — Ambulatory Visit: Payer: Self-pay | Admitting: Nurse Practitioner

## 2024-06-09 ENCOUNTER — Ambulatory Visit (INDEPENDENT_AMBULATORY_CARE_PROVIDER_SITE_OTHER)

## 2024-06-09 VITALS — BP 136/83 | HR 62 | Temp 98.2°F | Resp 16

## 2024-06-09 DIAGNOSIS — J209 Acute bronchitis, unspecified: Secondary | ICD-10-CM

## 2024-06-09 DIAGNOSIS — R051 Acute cough: Secondary | ICD-10-CM

## 2024-06-09 DIAGNOSIS — R059 Cough, unspecified: Secondary | ICD-10-CM | POA: Diagnosis not present

## 2024-06-09 MED ORDER — BENZONATATE 200 MG PO CAPS: 200.0000 mg | ORAL_CAPSULE | Freq: Three times a day (TID) | ORAL | 0 refills | Status: DC | PRN

## 2024-06-09 MED ORDER — AZITHROMYCIN 250 MG PO TABS: 250.0000 mg | ORAL_TABLET | Freq: Every day | ORAL | 0 refills | Status: DC

## 2024-06-09 NOTE — ED Triage Notes (Signed)
 Pt c/o dry cough, HA, felt hot, nausea, sore throatx1wk. Pt has been taking tylenol and using chlorseptic spray. The tylenol does help the HA.

## 2024-06-09 NOTE — Discharge Instructions (Signed)
 Start azithromycin antibiotic as prescribed.  You may take Tessalon 3 times a day as needed for your cough.  Focus on rest fluids and please follow-up with your PCP if your symptoms do not improve.  Please go to the ER if you develop any worsening symptoms.  I hope you feel better soon!

## 2024-06-09 NOTE — ED Provider Notes (Signed)
 [facility]    CSN: [id number] Arrival date & time: 06/09/24  1027      History   Chief Complaint Chief Complaint  Patient presents with   Cough    Fever, sore throat - Entered by patient    HPI Scott Cummings is a 53 y.o. male  presents for evaluation of URI symptoms for 7 days. Patient reports associated symptoms of dry cough with gesturing, headache, nausea, chills. Denies N/V/D, documented fevers, ear pain, body aches, shortness of breath. Patient does not have a hx of asthma. Patient is not an active smoker.   Reports had similar symptoms and wife has pneumonia.  Pt has taken Tylenol OTC for symptoms. Pt has no other concerns at this time.    Cough Associated symptoms: chills, headaches and sore throat     Past Medical History:  Diagnosis Date   Frequent headaches    History of chicken pox    Impingement syndrome of left shoulder region 07/29/2018   Migraine    Neuropathy    Right foot    Patient Active Problem List   Diagnosis Date Noted   Seasonal allergic rhinitis due to pollen 01/05/2022   Hyperlipidemia 12/05/2021   Prediabetes 12/05/2021   Impingement syndrome of right shoulder 03/21/2021   Dyschromia 02/04/2021   Insomnia 02/04/2021   IBS (irritable bowel syndrome) 04/03/2017   Patellofemoral syndrome 07/30/2015   Tension headache 06/02/2015    Past Surgical History:  Procedure Laterality Date   pinched nerve foot     Left   SHOULDER ARTHROSCOPY Left 08/2018   SHOULDER ARTHROSCOPY WITH SUBACROMIAL DECOMPRESSION Right 05/2021   WISDOM TOOTH EXTRACTION         Home Medications    Prior to Admission medications   Medication Sig Start Date End Date Taking? Authorizing Provider  azithromycin (ZITHROMAX) 250 MG tablet Take 1 tablet (250 mg total) by mouth daily. Take first 2 tablets together, then 1 every day until finished. 06/09/24  Yes Nicholai Willette, Jodi R, NP  benzonatate (TESSALON) 200 MG capsule Take 1 capsule (200 mg total) by mouth 3  (three) times daily as needed. 06/09/24  Yes Loreda Myla SAUNDERS, NP  Acetaminophen-Caffeine  (EXCEDRIN TENSION HEADACHE) 500-65 MG TABS Take by mouth.    [provider]  cetirizine  (ZYRTEC ) 10 MG tablet Take 1 tablet (10 mg total) by mouth daily. Patient not taking: Reported on 09/08/2022 01/05/22   Thedora Garnette HERO, MD  diazepam  (VALIUM ) 10 MG tablet TAKE 1/2 TO 1 TABLET(5 TO 10 MG) BY MOUTH AT BEDTIME AS NEEDED FOR SLEEP 01/13/24   Webb, Padonda B, FNP  loperamide (IMODIUM) 2 MG capsule Take by mouth daily.    [provider]  rOPINIRole  (REQUIP ) 0.25 MG tablet Take 1 tablet (0.25 mg total) by mouth at bedtime. May increase to 2 tablets after 7 days if symptoms are not improved. 09/08/22   Thedora Garnette HERO, MD    Family History Family History  Problem Relation Age of Onset   Cancer Mother        Breast   Migraines Mother        Living   Healthy Father        Living   Heart failure Maternal Grandmother    Healthy Daughter        x1   Allergies Son        x1   Stomach cancer Other        Great uncle   Colon cancer Neg Hx  Social History Social History   Tobacco Use   Smoking status: Former    Current packs/day: 0.00    Types: Cigarettes    Quit date: 1996    Years since quitting: 29.7   Smokeless tobacco: Never  Vaping Use   Vaping status: Never Used  Substance Use Topics   Alcohol use: Yes    Alcohol/week: 0.0 standard drinks of alcohol    Comment: Very rare   Drug use: Not Currently     Allergies   Hydrocodone, Oxycodone, and Naproxen   Review of Systems Review of Systems  Constitutional:  Positive for chills.  HENT:  Positive for congestion and sore throat.   Respiratory:  Positive for cough.   Gastrointestinal:  Positive for nausea.  Neurological:  Positive for headaches.     Physical Exam Triage Vital Signs ED Triage Vitals  Encounter Vitals Group     BP 06/09/24 1112 136/83     Girls Systolic BP Percentile --      Girls Diastolic BP  Percentile --      Boys Systolic BP Percentile --      Boys Diastolic BP Percentile --      Pulse Rate 06/09/24 1112 62     Resp 06/09/24 1112 16     Temp 06/09/24 1112 98.2 F (36.8 C)     Temp Source 06/09/24 1112 Oral     SpO2 06/09/24 1112 93 %     Weight --      Height --      Head Circumference --      Peak Flow --      Pain Score 06/09/24 1109 5     Pain Loc --      Pain Education --      Exclude from Growth Chart --    No data found.  Updated Vital Signs BP 136/83   Pulse 62   Temp 98.2 F (36.8 C) (Oral)   Resp 16   SpO2 93%   Visual Acuity Right Eye Distance:   Left Eye Distance:   Bilateral Distance:    Right Eye Near:   Left Eye Near:    Bilateral Near:     Physical Exam Vitals and nursing note reviewed.  Constitutional:      General: He is not in acute distress.    Appearance: Normal appearance. He is not ill-appearing or toxic-appearing.  HENT:     Head: Normocephalic and atraumatic.     Right Ear: Tympanic membrane and ear canal normal.     Left Ear: Tympanic membrane and ear canal normal.     Nose: Congestion present.     Mouth/Throat:     Mouth: Mucous membranes are moist.     Pharynx: Posterior oropharyngeal erythema present.  Eyes:     Pupils: Pupils are equal, round, and reactive to light.  Cardiovascular:     Rate and Rhythm: Normal rate and regular rhythm.     Heart sounds: Normal heart sounds.  Pulmonary:     Effort: Pulmonary effort is normal.     Breath sounds: Normal breath sounds. No wheezing or rhonchi.  Musculoskeletal:     Cervical back: Normal range of motion and neck supple.  Lymphadenopathy:     Cervical: No cervical adenopathy.  Skin:    General: Skin is warm and dry.  Neurological:     General: No focal deficit present.     Mental Status: He is alert and oriented to person, place, and time.  Psychiatric:        Mood and Affect: Mood normal.        Behavior: Behavior normal.      UC Treatments / Results   Labs (all labs ordered are listed, but only abnormal results are displayed) Labs Reviewed - No data to display  EKG   Radiology DG Chest 2 View Result Date: 06/09/2024 CLINICAL DATA:  Cough for 1 week. EXAM: CHEST - 2 VIEW COMPARISON:  None Available. FINDINGS: The heart size and mediastinal contours are within normal limits. Both lungs are clear. The visualized skeletal structures are unremarkable. IMPRESSION: No active cardiopulmonary disease. Electronically Signed   By: Norleen DELENA Kil M.D.   On: 06/09/2024 11:44    Procedures Procedures (including critical care time)  Medications Ordered in UC Medications - No data to display  Initial Impression / Assessment and Plan / UC Course  I have reviewed the triage vital signs and the nursing notes.  Pertinent labs & imaging results that were available during my care of the patient were reviewed by me and considered in my medical decision making (see chart for details).     Reviewed exam and symptoms with patient.  No red flags.  Negative chest x-ray.  Discussed bronchitis, start azithromycin and Tessalon.  Encouraged rest fluids and PCP follow-up if symptoms do not improve.  ER precautions reviewed. Final Clinical Impressions(s) / UC Diagnoses   Final diagnoses:  Acute cough  Acute bronchitis, unspecified organism     Discharge Instructions      Start azithromycin antibiotic as prescribed.  You may take Tessalon 3 times a day as needed for your cough.  Focus on rest fluids and please follow-up with your PCP if your symptoms do not improve.  Please go to the ER if you develop any worsening symptoms.  I hope you feel better soon!     ED Prescriptions     Medication Sig Dispense Auth. Provider   benzonatate (TESSALON) 200 MG capsule Take 1 capsule (200 mg total) by mouth 3 (three) times daily as needed. 20 capsule Jeiden Daughtridge, Jodi R, NP   azithromycin (ZITHROMAX) 250 MG tablet Take 1 tablet (250 mg total) by mouth daily. Take first 2  tablets together, then 1 every day until finished. 6 tablet Hawley Michel, Jodi R, NP      PDMP not reviewed this encounter.   Loreda Myla SAUNDERS, NP 06/09/24 1158

## 2024-09-19 ENCOUNTER — Other Ambulatory Visit

## 2024-09-19 ENCOUNTER — Ambulatory Visit: Payer: Self-pay | Admitting: Physician Assistant

## 2024-09-19 ENCOUNTER — Ambulatory Visit: Admitting: Physician Assistant

## 2024-09-19 ENCOUNTER — Telehealth: Payer: Self-pay | Admitting: Internal Medicine

## 2024-09-19 ENCOUNTER — Encounter: Payer: Self-pay | Admitting: Physician Assistant

## 2024-09-19 VITALS — BP 112/86 | HR 81 | Ht 72.0 in | Wt 201.4 lb

## 2024-09-19 DIAGNOSIS — R197 Diarrhea, unspecified: Secondary | ICD-10-CM

## 2024-09-19 DIAGNOSIS — R1013 Epigastric pain: Secondary | ICD-10-CM

## 2024-09-19 LAB — CBC WITH DIFFERENTIAL/PLATELET
Basophils Absolute: 0 K/uL (ref 0.0–0.1)
Basophils Relative: 0.5 % (ref 0.0–3.0)
Eosinophils Absolute: 0.1 K/uL (ref 0.0–0.7)
Eosinophils Relative: 0.8 % (ref 0.0–5.0)
HCT: 47.3 % (ref 39.0–52.0)
Hemoglobin: 16.2 g/dL (ref 13.0–17.0)
Lymphocytes Relative: 10.9 % — ABNORMAL LOW (ref 12.0–46.0)
Lymphs Abs: 0.9 K/uL (ref 0.7–4.0)
MCHC: 34.3 g/dL (ref 30.0–36.0)
MCV: 89.6 fl (ref 78.0–100.0)
Monocytes Absolute: 1 K/uL (ref 0.1–1.0)
Monocytes Relative: 12.2 % — ABNORMAL HIGH (ref 3.0–12.0)
Neutro Abs: 6 K/uL (ref 1.4–7.7)
Neutrophils Relative %: 75.6 % (ref 43.0–77.0)
Platelets: 251 K/uL (ref 150.0–400.0)
RBC: 5.28 Mil/uL (ref 4.22–5.81)
RDW: 13.2 % (ref 11.5–15.5)
WBC: 7.9 K/uL (ref 4.0–10.5)

## 2024-09-19 LAB — SEDIMENTATION RATE: Sed Rate: 8 mm/h (ref 0–20)

## 2024-09-19 LAB — COMPREHENSIVE METABOLIC PANEL WITH GFR
ALT: 44 U/L (ref 3–53)
AST: 29 U/L (ref 5–37)
Albumin: 4.4 g/dL (ref 3.5–5.2)
Alkaline Phosphatase: 78 U/L (ref 39–117)
BUN: 27 mg/dL — ABNORMAL HIGH (ref 6–23)
CO2: 30 meq/L (ref 19–32)
Calcium: 9 mg/dL (ref 8.4–10.5)
Chloride: 102 meq/L (ref 96–112)
Creatinine, Ser: 1.07 mg/dL (ref 0.40–1.50)
GFR: 80.62 mL/min
Glucose, Bld: 101 mg/dL — ABNORMAL HIGH (ref 70–99)
Potassium: 3.7 meq/L (ref 3.5–5.1)
Sodium: 141 meq/L (ref 135–145)
Total Bilirubin: 0.6 mg/dL (ref 0.2–1.2)
Total Protein: 7.4 g/dL (ref 6.0–8.3)

## 2024-09-19 LAB — LIPASE: Lipase: 15 U/L (ref 11.0–59.0)

## 2024-09-19 MED ORDER — ONDANSETRON 8 MG PO TBDP: 8.0000 mg | ORAL_TABLET | Freq: Three times a day (TID) | ORAL | 0 refills | Status: AC | PRN

## 2024-09-19 MED ORDER — FAMOTIDINE 40 MG PO TABS: 40.0000 mg | ORAL_TABLET | Freq: Every day | ORAL | 0 refills | Status: AC

## 2024-09-19 MED ORDER — DICYCLOMINE HCL 20 MG PO TABS: 20.0000 mg | ORAL_TABLET | Freq: Three times a day (TID) | ORAL | 0 refills | Status: DC | PRN

## 2024-09-19 MED ORDER — VANCOMYCIN HCL 125 MG PO CAPS: 125.0000 mg | ORAL_CAPSULE | Freq: Four times a day (QID) | ORAL | 0 refills | Status: AC

## 2024-09-19 NOTE — Telephone Encounter (Signed)
 Call from pts wife stating pt is experiencing nausea,vomiting,Diarrhea.  Scheduled pt 09/25/2024 @10 :10am Spouse stated she was in hospital due to same symptoms and would like her husband seen today or sooner. Please advise. Thank you.

## 2024-09-19 NOTE — Patient Instructions (Addendum)
 Your provider has requested that you go to the basement level for lab work before leaving today. Press B on the elevator. The lab is located at the first door on the left as you exit the elevator.  Your provider has ordered [employer] stool testing for you. You have received a kit from our office today containing all necessary supplies to complete this test. Please carefully read the stool collection instructions provided in the kit before opening the accompanying materials. In addition, be sure there is a label providing your full name and date of birth on the puritan opti-swab tube that is supplied in the kit (if you do not see a label with this information on your test tube, please make us  aware before test collection!). After completing the test, you should secure the purtian tube into the specimen biohazard bag. The [facility] E-Req sheet (including date and time of specimen collection) should be placed into the outside pocket of the specimen biohazard bag and returned to the [facility] lab (basement floor of [facility]) within 3 days of collection. Please make sure to give the specimen to a staff member at the lab. DO NOT leave the specimen on the counter.   If the specimen date and time (can be found in the upper right boxed portion of the sheet) are not filled out on the E-Req sheet, the test will NOT be performed.    Take the pepcid  at night Avoid spicy and acidic foods Avoid fatty foods Limit your intake of coffee, tea, alcohol, and carbonated drinks Work to maintain a healthy weight Keep the head of the bed elevated at least 3 inches with blocks or a wedge pillow if you are having any nighttime symptoms Stay upright for 2 hours after eating Avoid meals and snacks three to four hours before bedtime  - Drink a lot of liquids that have water, salt, and sugar. Good choices are water mixed with juice, flavored soda, and soup broth. If you are drinking enough,  your urine will be light yellow or almost clear.  - Try to eat a little food. Good choices are potatoes, noodles, rice, oatmeal, crackers, bananas, soup, and boiled vegetables.  - Avoid high fat foods, as they can make diarrhea worse.  - Dairy products (except yogurt) may be difficult to digest when you have diarrhea. I recommend that you temporarily avoid lactose-containing foods.  - If loperamide is not working, you could try bismuth salicylate (Pepto-Bismol) 30 mL or two tablets every 30 minutes for eight doses. Pepto-Bismol may make your stools black.   Go to the ER if any severe abdominal pain, fever, or weakness  Due to recent changes in healthcare laws, you may see the results of your imaging and laboratory studies on MyChart before your provider has had a chance to review them.  We understand that in some cases there may be results that are confusing or concerning to you. Not all laboratory results come back in the same time frame and the provider may be waiting for multiple results in order to interpret others.  Please give us  48 hours in order for your provider to thoroughly review all the results before contacting the office for clarification of your results.    I appreciate the  opportunity to care for you  Thank You   [facility]

## 2024-09-19 NOTE — Telephone Encounter (Signed)
See MyChart message for further communication.

## 2024-09-19 NOTE — Progress Notes (Signed)
 "    09/19/2024 Antwaun Buth [id number] Jun 19, 1971  Referring provider: Thedora Garnette HERO, MD Primary GI doctor: will determine (Dr. Teressa)  ASSESSMENT AND PLAN:  Diarrhea x 2 days, wife with recent Cdiff, history of IBS-D predominant 07/09/2015 colonoscopy normal TI, unremarkable negative microscopic colitis, recall colonoscopy 06/2025 Wife recently diagnosed and treated with C. Difficile Diarrhea x 2 days, diarrhea every hour, fever, nausea, AB pain No recent ABX Acute, severe watery diarrhea with nausea and intermittent fever. High suspicion for C. difficile infection, though alternative etiologies possible. No hemodynamic instability or peritoneal signs. PLAN:  - Performed rectal examination and obtained stool sample for C. difficile and H. pylori testing. - Ordered CBC, lipase, and erythrocyte sedimentation rate. - Initiated empiric oral vancomycin  with instructions to discontinue if C. difficile testing is negative. - Advised against further loperamide use due to risk of toxic megacolon if C. difficile is confirmed. - Provided supportive care recommendations including hydration and electrolyte replacement. - Prescribed dicyclomine  for abdominal pain and cramping. - Instructed to monitor for worsening symptoms and seek emergency care if symptoms become severe. - Arranged follow-up to review laboratory and stool study results and adjust management accordingly. - patient wishes to keep appointment 01/15, will call Monday to cancel pending on hos he is doing  Epigastric pain/dyspepsia 2016 negative celiac Seen 08/2023 Never set up for EGD abdominal ultrasound due to improvement of symptoms - check lipase/LFTs - Prescribed famotidine  for acid suppression, avoiding proton pump inhibitors until Cdiff testing is performed - Ordered H. pylori stool antigen testing. - Provided education on hand hygiene and environmental cleaning to reduce risk of transmission and recurrence. - consider AB  US /EGD pending results  [facility]  Recall 06/2025  Patient Care Team: Thedora Garnette HERO, MD as PCP - General (Family Medicine)  HISTORY OF PRESENT ILLNESS: 54 y.o. male with a past medical history listed below presents for evaluation of diarrhea.   Last seen in the office 08/18/2023 IBS diarrhea and epigastric pain, seen by Elida Nyle Sharps, NP.  Discussed the use of AI scribe software for clinical note transcription with the patient, who gave verbal consent to proceed.  History of Present Illness   Prosper Paff is a 54 year old male with longstanding irritable bowel syndrome with diarrhea who presents with acute onset of severe diarrhea.  Two days prior to presentation, he developed acute onset of watery, yellowish diarrhea, initially occurring every 90 minutes and progressing to hourly episodes. He describes this episode as like normal symptoms on steroids compared to his baseline IBS symptoms. He has experienced intermittent fever, though temperatures were normal on the day of the visit. No chills reported.  Significant nausea has accompanied the diarrhea, with difficulty tolerating oral intake and only able to sip water. Eating exacerbates symptoms, and he describes himself as very nauseous.  Epigastric pain is present, described as like a blowfish is in my belly, radiating to the upper abdomen. He denies heartburn but endorses reflux and acid regurgitation, stating he can't even lay down to sleep. No chest discomfort or shortness of breath.  He took one dose of Imodium without significant relief and did not continue it. He also took a couple of his spouse's nausea medications, which provided temporary relief for about an hour, but symptoms recurred after eating. He took dicyclomine  (Bentyl ) the previous night and was unsure if it helped.  No new medications started in the past six months to a year. Current medications include Zyrtec  and occasional Valium , both as needed.  No recent  antibiotic use. No alcohol or tobacco use. No recent exposure to sick contacts other than his spouse, who has had similar symptoms. He is using a separate bathroom and practicing good hand hygiene.  IBS with diarrhea has been present for fifteen years, but usual bowel movements are less severe than the current episode. He typically does not have solid stools, but this episode is characterized by much more frequent and watery diarrhea. No prior C. difficile infection. Colonoscopy was performed in 2016, with another due in October. He was last seen in December 2024 for epigastric pain, at which time EGD and ultrasound were scheduled but canceled due to symptom improvement. He and his spouse recall that episode was different from the current illness. No family history of gallbladder issues.      He  reports that he quit smoking about 30 years ago. His smoking use included cigarettes. He has never used smokeless tobacco. He reports current alcohol use. He reports that he does not currently use drugs.  RELEVANT GI HISTORY, IMAGING AND LABS: Results   Diagnostic Colonoscopy (2016): Normal  Digital rectal examination with stool collection Perianal erythema. No rectal masses. Minimal stool present. No gross blood detected on examination.      CBC    Component Value Date/Time   WBC 5.6 10/04/2020 1006   RBC 5.02 10/04/2020 1006   HGB 15.3 10/04/2020 1006   HCT 45.2 10/04/2020 1006   PLT 235.0 10/04/2020 1006   MCV 90.0 10/04/2020 1006   MCHC 33.8 10/04/2020 1006   RDW 13.2 10/04/2020 1006   LYMPHSABS 0.9 10/04/2020 1006   MONOABS 0.5 10/04/2020 1006   EOSABS 0.1 10/04/2020 1006   BASOSABS 0.1 10/04/2020 1006   No results for input(s): HGB in the last 8760 hours.  CMP     Component Value Date/Time   NA 141 10/04/2020 1006   K 4.1 10/04/2020 1006   CL 104 10/04/2020 1006   CO2 31 10/04/2020 1006   GLUCOSE 94 12/05/2021 1001   BUN 16 10/04/2020 1006   CREATININE 1.22 10/04/2020 1006    CALCIUM 9.7 10/04/2020 1006   PROT 7.0 10/04/2020 1006   ALBUMIN 4.5 10/04/2020 1006   AST 23 10/04/2020 1006   ALT 25 10/04/2020 1006   ALKPHOS 79 10/04/2020 1006   BILITOT 0.7 10/04/2020 1006      Latest Ref Rng & Units 10/04/2020   10:06 AM 11/14/2018    8:38 AM 07/25/2016    2:01 PM  Hepatic Function  Total Protein 6.0 - 8.3 g/dL 7.0  6.6  7.4   Albumin 3.5 - 5.2 g/dL 4.5  4.4  4.8   AST 0 - 37 U/L 23  20  18    ALT 0 - 53 U/L 25  21  30    Alk Phosphatase 39 - 117 U/L 79  74  60   Total Bilirubin 0.2 - 1.2 mg/dL 0.7  0.6  1.0       Current Medications:   Current Outpatient Medications (Respiratory):    cetirizine  (ZYRTEC ) 10 MG tablet, Take 1 tablet (10 mg total) by mouth daily. (Patient not taking: Reported on 09/08/2022)  Current Outpatient Medications (Analgesics):    Acetaminophen-Caffeine  (EXCEDRIN TENSION HEADACHE) 500-65 MG TABS, Take by mouth. (Patient taking differently: Take 1 tablet by mouth as needed.)  Current Outpatient Medications (Other):    diazepam  (VALIUM ) 10 MG tablet, TAKE 1/2 TO 1 TABLET(5 TO 10 MG) BY MOUTH AT BEDTIME AS NEEDED FOR SLEEP  dicyclomine  (BENTYL ) 20 MG tablet, Take 1 tablet (20 mg total) by mouth 3 (three) times daily as needed for spasms.   famotidine  (PEPCID ) 40 MG tablet, Take 1 tablet (40 mg total) by mouth at bedtime.   ondansetron  (ZOFRAN -ODT) 8 MG disintegrating tablet, Take 1 tablet (8 mg total) by mouth every 8 (eight) hours as needed for nausea or vomiting.   vancomycin  (VANCOCIN ) 125 MG capsule, Take 1 capsule (125 mg total) by mouth 4 (four) times daily for 14 days.  Medical History:  Past Medical History:  Diagnosis Date   Frequent headaches    History of chicken pox    Impingement syndrome of left shoulder region 07/29/2018   Migraine    Neuropathy    Right foot   Allergies: Allergies[1]   Surgical History:  He  has a past surgical history that includes pinched nerve foot; Wisdom tooth extraction; Shoulder  arthroscopy (Left, 08/2018); and Shoulder arthroscopy with subacromial decompression (Right, 05/2021). Family History:  His family history includes Allergies in his son; Cancer in his mother; Healthy in his daughter and father; Heart failure in his maternal grandmother; Migraines in his mother; Stomach cancer in an other family member.  REVIEW OF SYSTEMS  : All other systems reviewed and negative except where noted in the History of Present Illness.  PHYSICAL EXAM: BP 112/86   Pulse 81   Ht 6' (1.829 m)   Wt 201 lb 6.4 oz (91.4 kg)   BMI 27.31 kg/m  Physical Exam   GENERAL APPEARANCE: Well nourished, in no apparent distress HEENT: No cervical lymphadenopathy, unremarkable thyroid , sclerae anicteric, conjunctiva pink RESPIRATORY: Respiratory effort normal, BS equal bilateral without rales, rhonchi, wheezing CARDIO: RRR with no MRGs, peripheral pulses intact ABDOMEN: Soft, non distended, active bowel sounds in all 4 quadrants, no tenderness to palpation, no rebound, no mass appreciated RECTAL: Rectal exam normal, no rectal pain, rectal erythema noted, no rectal masses, hemoccult negative MUSCULOSKELETAL: Full ROM, normal gait, without edema SKIN: Dry, intact without rashes or lesions. No jaundice. NEURO: Alert, oriented, no focal deficits PSYCH: Cooperative, normal mood and affect.      Alan JONELLE Coombs, PA-C 11:40 AM      [1]  Allergies Allergen Reactions   Hydrocodone Nausea And Vomiting and Nausea Only   Oxycodone Nausea And Vomiting and Nausea Only   Naproxen Nausea Only   "

## 2024-09-22 ENCOUNTER — Telehealth: Payer: Self-pay | Admitting: Physician Assistant

## 2024-09-22 NOTE — Telephone Encounter (Signed)
 [employer] stool negative for Campylobacter C. difficile, E. coli Shigella and Salmonella [facility] stool negative for H. pylori

## 2024-09-25 ENCOUNTER — Ambulatory Visit: Admitting: Internal Medicine

## 2024-10-01 ENCOUNTER — Encounter: Payer: Self-pay | Admitting: Physician Assistant

## 2024-10-07 NOTE — Telephone Encounter (Addendum)
 Patient's wife called stating patient is having a lot of abdominal pain and diarrhea. Patient has been added onto tomorrow's schedule 1/28 with Alan. Routing note to [employer] since they will be with Alan for tomorrow. Please advise, thank you

## 2024-10-07 NOTE — Progress Notes (Unsigned)
 "    10/08/2024 Scott Cummings [id number] [birth year]  Referring provider: Thedora Garnette HERO, MD Primary GI doctor: Dr. Federico (Dr. Teressa)  ASSESSMENT AND PLAN:  IBS-D predominate x 15 years, has failed viberzi  in the past 07/09/2015 colonoscopy normal TI, unremarkable negative microscopic colitis, recall colonoscopy 06/2025 06/2025 [facility] stool negative for Campylobacter C. difficile, E. coli Shigella and Salmonella   dicyclomine  did not help PLAN: -Can do trial of IBGARD daily, levsin  sent in -FODMAP,  and lifestyle changes discussed -Get  fecal calprotectin - KUB to evaluate for stool burden -xifaxin trial given for IBS-D x 15 years, patient has failed viberzi  in the past - consider repeat colonoscopy sooner pending response to medications and labs  Epigastric pain/dyspepsia 2016 negative celiac 2026 [facility] H pylori stool negative - add on pepcid , consider PPI -Get RUQ US   -EGD pending results  [facility]  Recall 06/2025  Patient Care Team: Thedora Garnette HERO, MD as PCP - General (Family Medicine)  HISTORY OF PRESENT ILLNESS: 54 y.o. male with a past medical history listed below presents for evaluation of diarrhea.   I last saw the patient in the office 09/19/2024 for diarrhea.  Patient had negative Diatherix, H. pylori, CBC sed rate lipase.  Discussed the use of AI scribe software for clinical note transcription with the patient, who gave verbal consent to proceed.  History of Present Illness   Scott Cummings is a 54 year old male with longstanding irritable bowel syndrome with diarrhea predominance who presents with new-onset constipation alternating with diarrhea.  For the past 15 years, he has experienced loose, poorly formed stools. Previous treatments have included Viberzi , which provided three days of normal stools before symptoms worsened, and intermittent use of Imodium. He has not used Miralax in months. Dietary modifications, including dairy restriction and caffeine   elimination, have not affected his symptoms. His last colonoscopy was in 2016, during ongoing diarrhea.  Last week, he had an episode of diarrhea and was evaluated for C. difficile infection, which was negative; symptoms improved initially. Since Friday, he developed new constipation with frequent urges to defecate but minimal stool output, progressing to near-complete constipation with significant bloating and abdominal distension. Dulcolax two nights ago resulted in a large bowel movement and temporary relief. The following day, a powder stool softener provided little benefit, and he continues to experience pain and pressure with minimal stool output.  He describes passing bright yellow liquid with wiping, which he believes to be stomach acid or bile, and has not previously experienced this. Small amounts of stool appear moldy and exploded. Frequent urges to defecate persist, especially with movement, but little stool is produced and yellow liquid continues. He denies blood in the stool but has seen blood on toilet paper from frequent wiping. Passing gas provides minor relief, though he is generally unable to do so without attempting a bowel movement.  Dicyclomine  has been used as needed, with no benefit and significant drowsiness when taking two tablets. He has not yet tried the prescribed antacid. Discomfort and pressure are present in both the upper and lower abdomen, with greater discomfort in the lower abdomen. He denies heartburn, reflux, fever, chills, nausea, dark stools, joint pain, rashes, and family history of gallbladder issues. Current symptoms differ from his usual IBS-D pattern, which is typically loose, unformed stools without constipation.      He  reports that he quit smoking about 30 years ago. His smoking use included cigarettes. He has never used smokeless tobacco. He reports current alcohol use.  He reports that he does not currently use drugs.  RELEVANT GI HISTORY, IMAGING AND  LABS: Results   Labs Clostridioides difficile: Negative      CBC    Component Value Date/Time   WBC 7.9 09/19/2024 1141   RBC 5.28 09/19/2024 1141   HGB 16.2 09/19/2024 1141   HCT 47.3 09/19/2024 1141   PLT 251.0 09/19/2024 1141   MCV 89.6 09/19/2024 1141   MCHC 34.3 09/19/2024 1141   RDW 13.2 09/19/2024 1141   LYMPHSABS 0.9 09/19/2024 1141   MONOABS 1.0 09/19/2024 1141   EOSABS 0.1 09/19/2024 1141   BASOSABS 0.0 09/19/2024 1141   Recent Labs    09/19/24 1141  HGB 16.2    CMP     Component Value Date/Time   NA 141 09/19/2024 1141   K 3.7 09/19/2024 1141   CL 102 09/19/2024 1141   CO2 30 09/19/2024 1141   GLUCOSE 101 (H) 09/19/2024 1141   BUN 27 (H) 09/19/2024 1141   CREATININE 1.07 09/19/2024 1141   CALCIUM 9.0 09/19/2024 1141   PROT 7.4 09/19/2024 1141   ALBUMIN 4.4 09/19/2024 1141   AST 29 09/19/2024 1141   ALT 44 09/19/2024 1141   ALKPHOS 78 09/19/2024 1141   BILITOT 0.6 09/19/2024 1141      Latest Ref Rng & Units 09/19/2024   11:41 AM 10/04/2020   10:06 AM 11/14/2018    8:38 AM  Hepatic Function  Total Protein 6.0 - 8.3 g/dL 7.4  7.0  6.6   Albumin 3.5 - 5.2 g/dL 4.4  4.5  4.4   AST 5 - 37 U/L 29  23  20    ALT 3 - 53 U/L 44  25  21   Alk Phosphatase 39 - 117 U/L 78  79  74   Total Bilirubin 0.2 - 1.2 mg/dL 0.6  0.7  0.6       Current Medications:   Current Outpatient Medications (Respiratory):    cetirizine  (ZYRTEC ) 10 MG tablet, Take 1 tablet (10 mg total) by mouth daily. (Patient taking differently: Take 10 mg by mouth daily as needed.)  Current Outpatient Medications (Analgesics):    Acetaminophen-Caffeine  (EXCEDRIN TENSION HEADACHE) 500-65 MG TABS, Take by mouth. (Patient taking differently: Take 1 tablet by mouth as needed.)  Current Outpatient Medications (Other):    diazepam  (VALIUM ) 10 MG tablet, TAKE 1/2 TO 1 TABLET(5 TO 10 MG) BY MOUTH AT BEDTIME AS NEEDED FOR SLEEP (Patient taking differently: as needed.)   hyoscyamine  (LEVSIN ) 0.125 MG  tablet, Take 1 tablet (0.125 mg total) by mouth every 6 (six) hours as needed for cramping.   ondansetron  (ZOFRAN -ODT) 8 MG disintegrating tablet, Take 1 tablet (8 mg total) by mouth every 8 (eight) hours as needed for nausea or vomiting.   rifaximin  (XIFAXAN ) 550 MG TABS tablet, Take 1 tablet (550 mg total) by mouth 3 (three) times daily for 14 days.   famotidine  (PEPCID ) 40 MG tablet, Take 1 tablet (40 mg total) by mouth at bedtime. (Patient not taking: Reported on 10/08/2024)  Medical History:  Past Medical History:  Diagnosis Date   Frequent headaches    History of chicken pox    Impingement syndrome of left shoulder region 07/29/2018   Migraine    Neuropathy    Right foot   Allergies: Allergies[1]   Surgical History:  He  has a past surgical history that includes pinched nerve foot; Wisdom tooth extraction; Shoulder arthroscopy (Left, 08/2018); and Shoulder arthroscopy with subacromial decompression (Right, 05/2021). Family  History:  His family history includes Allergies in his son; Cancer in his mother; Healthy in his daughter and father; Heart failure in his maternal grandmother; Migraines in his mother; Stomach cancer in an other family member.  REVIEW OF SYSTEMS  : All other systems reviewed and negative except where noted in the History of Present Illness.  PHYSICAL EXAM: BP 108/78   Pulse 74   Ht 5' 11 (1.803 m)   Wt 201 lb 2 oz (91.2 kg)   BMI 28.05 kg/m  Physical Exam   GENERAL APPEARANCE: Well nourished, in no apparent distress. HEENT: No cervical lymphadenopathy, unremarkable thyroid , sclerae anicteric, conjunctiva pink. RESPIRATORY: Respiratory effort normal, breath sounds equal bilaterally without rales, rhonchi, or wheezing. CARDIO: Regular rate and rhythm with no murmurs, rubs, or gallops, peripheral pulses intact. ABDOMEN: Soft, non-distended, active bowel sounds in all four quadrants, tenderness in the right upper quadrant and lower abdomen, no rebound  tenderness, no mass appreciated. RECTAL: Declines. MUSCULOSKELETAL: Full range of motion, normal gait, without edema. SKIN: Dry, intact without rashes or lesions. No jaundice. NEURO: Alert, oriented, no focal deficits. PSYCH: Cooperative, normal mood and affect.      Alan JONELLE Coombs, PA-C 9:39 AM      [1]  Allergies Allergen Reactions   Hydrocodone Nausea And Vomiting and Nausea Only   Oxycodone Nausea And Vomiting and Nausea Only   Naproxen Nausea Only   "

## 2024-10-08 ENCOUNTER — Encounter: Payer: Self-pay | Admitting: Physician Assistant

## 2024-10-08 ENCOUNTER — Ambulatory Visit (INDEPENDENT_AMBULATORY_CARE_PROVIDER_SITE_OTHER)
Admission: RE | Admit: 2024-10-08 | Discharge: 2024-10-08 | Disposition: A | Discharge status: Home / Self Care | Source: Ambulatory Visit | Attending: Physician Assistant | Admitting: Physician Assistant

## 2024-10-08 ENCOUNTER — Other Ambulatory Visit

## 2024-10-08 ENCOUNTER — Ambulatory Visit: Admitting: Physician Assistant

## 2024-10-08 VITALS — BP 108/78 | HR 74 | Ht 71.0 in | Wt 201.1 lb

## 2024-10-08 DIAGNOSIS — R1013 Epigastric pain: Secondary | ICD-10-CM | POA: Diagnosis not present

## 2024-10-08 DIAGNOSIS — R197 Diarrhea, unspecified: Secondary | ICD-10-CM | POA: Diagnosis not present

## 2024-10-08 DIAGNOSIS — K580 Irritable bowel syndrome with diarrhea: Secondary | ICD-10-CM | POA: Diagnosis not present

## 2024-10-08 MED ORDER — HYOSCYAMINE SULFATE 0.125 MG PO TABS: 0.1250 mg | ORAL_TABLET | Freq: Four times a day (QID) | ORAL | 0 refills | Status: AC | PRN

## 2024-10-08 MED ORDER — RIFAXIMIN 550 MG PO TABS: 550.0000 mg | ORAL_TABLET | Freq: Three times a day (TID) | ORAL | 0 refills | Status: AC

## 2024-10-08 NOTE — Patient Instructions (Addendum)
 Your provider has requested that you have an abdominal x ray before leaving today. Please go to the basement floor to our Radiology department for the test.  Your provider has requested that you go to the basement level for lab work before leaving today. Press B on the elevator. The lab is located at the first door on the left as you exit the elevator.  You have been scheduled for an abdominal ultrasound at [facility] (Main Entrance [address]) on 10-13-24 at 10am. Please arrive 15 minutes prior to your appointment for registration. Make certain not to have anything to eat or drink midnight prior to your appointment. Should you need to reschedule your appointment, please contact radiology at [phone]. This test typically takes about 30 minutes to perform.  You have been scheduled for an appointment with Alan Coombs PA-C on 12-03-24 at 920am . Please arrive 10 minutes early for your appointment.    First do a trial off milk/lactose products if you use them.  Add fiber like benefiber or citracel once a day  Can send in an anti spasm medication, Levsin , to take as needed  VISIT SUMMARY:  During your visit, we discussed your longstanding irritable bowel syndrome (IBS) with diarrhea predominance and recent onset of alternating constipation and diarrhea. We also addressed your upper abdominal discomfort and possible biliary dyskinesia.  YOUR PLAN:  IRRITABLE BOWEL SYNDROME WITH DIARRHEA: You have chronic, severe IBS with diarrhea, recently exacerbated with alternating constipation. This may be due to multiple factors, including possible small intestinal bacterial overgrowth (SIBO). -Prescribed Xifaxan  for IBS with diarrhea and possible SIBO. -Provided dietary counseling regarding FODMAP diet and elimination of dietary triggers. -Discussed fiber supplementation as supportive therapy. -Offered samples of digestive enzyme if dietary triggers are suspected. -Planned stool test to  evaluate for inflammation. -Discussed possible future use of amitriptyline for visceral hypersensitivity if current interventions are ineffective. -Planned colonoscopy if symptoms persist or do not improve after current interventions.  DYSPEPSIA WITH POSSIBLE BILIARY DYSKINESIA: You have chronic upper abdominal discomfort and right upper quadrant tenderness with yellow stool, which may be due to biliary dyskinesia. -Ordered right upper quadrant abdominal ultrasound to evaluate for biliary dyskinesia or other hepatobiliary pathology. -Advised trial of antacid as previously prescribed to assess for symptom improvement.  CONSTIPATION: You have recently experienced constipation alternating with diarrhea, partially responsive to laxatives and stool softener. -Ordered abdominal x-ray to assess for retained stool burden. -Prescribed Levsin  (hyoscyamine ) as alternative antispasmodic for cramping and discomfort. -Advised discontinuation of dicyclomine  due to lack of efficacy and adverse effects (sedation).   FODMAP stands for fermentable oligo-, di-, mono-saccharides and polyols (1). These are the scientific terms used to classify groups of carbs that are difficult for our body to digest and that are notorious for triggering digestive symptoms like bloating, gas, loose stools and stomach pain.   You can try low FODMAP diet  - start with eliminating just one column at a time that you feel may be a trigger for you. - the table at the very bottom contains foods that are low in FODMAPs   Sometimes trying to eliminate the FODMAP's from your diet is difficult or tricky, if you are stuggling with trying to do the elimination diet you can try an enzyme.  There is a food enzymes that you sprinkle in or on your food that helps break down the FODMAP. You can read more about the enzyme by going to this site: https://fodzyme.com/    Small intestinal bacterial  overgrowth (SIBO) occurs when there is an abnormal  increase in the overall bacterial population in the small intestine -- particularly types of bacteria not commonly found in that part of the digestive tract. Small intestinal bacterial overgrowth (SIBO) commonly results when a circumstance -- such as surgery or disease -- slows the passage of food and waste products in the digestive tract, creating a breeding ground for bacteria.  Signs and symptoms of SIBO often include: Loss of appetite Abdominal pain Nausea Bloating An uncomfortable feeling of fullness after eating Diarrhea or constipation, depending on the type of gas produced  What foods trigger SIBO? While foods arent the original cause of SIBO, certain foods do encourage the overgrowth of the wrong bacteria in your small intestine. If youre feeding them their favorite foods, theyre going to grow more, and that will trigger more of your SIBO symptoms. By the same token, you can help reduce the overgrowth by starving the problematic bacteria of their favorite foods. This strategy has led to a number of proposed SIBO eating plans. The plans vary, and so do individual results. But in general, they tend to recommend limiting carbohydrates.  These include: Sugars and sweeteners. Fruits and starchy vegetables. Dairy products. Grains.  There is a test for this we can do called a breath test, if you are positive we will treat you with an antibiotic to see if it helps.  Your symptoms are very suspicious for this condition, as discussed, we will start you on an antibiotic to see if this helps.

## 2024-10-09 ENCOUNTER — Other Ambulatory Visit

## 2024-10-12 LAB — CALPROTECTIN, FECAL: Calprotectin, Fecal: 2190 ug/g — ABNORMAL HIGH (ref 0–120)

## 2024-10-13 ENCOUNTER — Ambulatory Visit (HOSPITAL_BASED_OUTPATIENT_CLINIC_OR_DEPARTMENT_OTHER)
Admission: RE | Admit: 2024-10-13 | Discharge: 2024-10-13 | Disposition: A | Source: Ambulatory Visit | Attending: Physician Assistant | Admitting: Physician Assistant

## 2024-10-13 DIAGNOSIS — R1013 Epigastric pain: Secondary | ICD-10-CM

## 2024-10-14 ENCOUNTER — Other Ambulatory Visit

## 2024-10-14 ENCOUNTER — Ambulatory Visit: Payer: Self-pay | Admitting: Physician Assistant

## 2024-10-14 DIAGNOSIS — R197 Diarrhea, unspecified: Secondary | ICD-10-CM

## 2024-10-14 DIAGNOSIS — K580 Irritable bowel syndrome with diarrhea: Secondary | ICD-10-CM

## 2024-10-14 LAB — FECAL OCCULT BLOOD, IMMUNOCHEMICAL: Fecal Occult Bld: POSITIVE — AB

## 2024-10-15 LAB — C. DIFFICILE GDH AND TOXIN A/B
GDH ANTIGEN: DETECTED
MICRO NUMBER:: 17542669
SPECIMEN QUALITY:: ADEQUATE
TOXIN A AND B: DETECTED

## 2024-10-28 ENCOUNTER — Encounter: Admitting: Medical

## 2024-11-05 ENCOUNTER — Ambulatory Visit: Admitting: Physician Assistant

## 2024-12-03 ENCOUNTER — Ambulatory Visit: Admitting: Physician Assistant
